# Patient Record
Sex: Female | Born: 1980 | Race: Black or African American | Hispanic: No | Marital: Single | Smoking: Never smoker
Health system: Southern US, Community
[De-identification: ages and names within clinical notes are randomized; demographics above are authoritative.]

## PROBLEM LIST (undated history)

## (undated) DIAGNOSIS — D219 Benign neoplasm of connective and other soft tissue, unspecified: Secondary | ICD-10-CM

## (undated) DIAGNOSIS — T7840XA Allergy, unspecified, initial encounter: Secondary | ICD-10-CM

## (undated) DIAGNOSIS — D649 Anemia, unspecified: Secondary | ICD-10-CM

## (undated) HISTORY — DX: Allergy, unspecified, initial encounter: T78.40XA

---

## 2001-02-06 ENCOUNTER — Emergency Department (HOSPITAL_COMMUNITY): Admission: EM | Admit: 2001-02-06 | Discharge: 2001-02-06 | Payer: Self-pay | Admitting: Emergency Medicine

## 2002-11-24 ENCOUNTER — Other Ambulatory Visit: Admission: RE | Admit: 2002-11-24 | Discharge: 2002-11-24 | Payer: Self-pay | Admitting: Obstetrics and Gynecology

## 2003-05-13 ENCOUNTER — Inpatient Hospital Stay (HOSPITAL_COMMUNITY): Admission: AD | Admit: 2003-05-13 | Discharge: 2003-05-16 | Payer: Self-pay | Admitting: Obstetrics & Gynecology

## 2003-07-02 ENCOUNTER — Other Ambulatory Visit: Admission: RE | Admit: 2003-07-02 | Discharge: 2003-07-02 | Payer: Self-pay | Admitting: *Deleted

## 2015-01-24 ENCOUNTER — Emergency Department (HOSPITAL_COMMUNITY): Payer: 59

## 2015-01-24 ENCOUNTER — Emergency Department (HOSPITAL_COMMUNITY)
Admission: EM | Admit: 2015-01-24 | Discharge: 2015-01-24 | Disposition: A | Discharge status: Home / Self Care | Payer: 59 | Attending: Emergency Medicine | Admitting: Emergency Medicine

## 2015-01-24 ENCOUNTER — Ambulatory Visit (INDEPENDENT_AMBULATORY_CARE_PROVIDER_SITE_OTHER): Payer: Self-pay | Admitting: Emergency Medicine

## 2015-01-24 ENCOUNTER — Encounter (HOSPITAL_COMMUNITY): Payer: Self-pay | Admitting: Nurse Practitioner

## 2015-01-24 VITALS — BP 120/82 | HR 95 | Temp 98.7°F | Resp 16 | Ht 64.0 in | Wt 132.8 lb

## 2015-01-24 DIAGNOSIS — R079 Chest pain, unspecified: Secondary | ICD-10-CM | POA: Insufficient documentation

## 2015-01-24 DIAGNOSIS — Z79899 Other long term (current) drug therapy: Secondary | ICD-10-CM | POA: Diagnosis not present

## 2015-01-24 DIAGNOSIS — M549 Dorsalgia, unspecified: Secondary | ICD-10-CM | POA: Insufficient documentation

## 2015-01-24 LAB — CBC
HCT: 37 % (ref 36.0–46.0)
Hemoglobin: 12.7 g/dL (ref 12.0–15.0)
MCH: 28.5 pg (ref 26.0–34.0)
MCHC: 34.3 g/dL (ref 30.0–36.0)
MCV: 83.1 fL (ref 78.0–100.0)
Platelets: 360 10*3/uL (ref 150–400)
RBC: 4.45 MIL/uL (ref 3.87–5.11)
RDW: 12.6 % (ref 11.5–15.5)
WBC: 6.3 10*3/uL (ref 4.0–10.5)

## 2015-01-24 LAB — BASIC METABOLIC PANEL
Anion gap: 11 (ref 5–15)
BUN: 9 mg/dL (ref 6–23)
CO2: 23 mmol/L (ref 19–32)
Calcium: 9.7 mg/dL (ref 8.4–10.5)
Chloride: 106 mmol/L (ref 96–112)
Creatinine, Ser: 0.82 mg/dL (ref 0.50–1.10)
GFR calc Af Amer: >90 mL/min (ref >90)
GFR calc non Af Amer: >90 mL/min (ref >90)
Glucose, Bld: 120 mg/dL — ABNORMAL HIGH (ref 70–99)
Potassium: 3.7 mmol/L (ref 3.5–5.1)
Sodium: 140 mmol/L (ref 135–145)

## 2015-01-24 LAB — I-STAT TROPONIN, ED
Troponin i, poc: 0 ng/mL (ref 0.00–0.08)
Troponin i, poc: 0 ng/mL (ref 0.00–0.08)

## 2015-01-24 MED ORDER — METHOCARBAMOL 500 MG PO TABS: 500.0000 mg | ORAL_TABLET | Freq: Two times a day (BID) | ORAL | Status: DC

## 2015-01-24 MED ORDER — MELOXICAM 7.5 MG PO TABS: 7.5000 mg | ORAL_TABLET | Freq: Every day | ORAL | Status: DC

## 2015-01-24 NOTE — ED Notes (Signed)
Pt reports chest pain since waking this am. Pain has been intermittent "heaviness" all day. Pain radiates into back and L shoulder. Nothing alleviates or aggravates the pain. She tried tums with no relief. She denis cardiac history. She is A&Ox4, breathing easy and unlabored

## 2015-01-24 NOTE — Progress Notes (Signed)
[facility] [address] [place] [id number] [phone]  Date:  01/24/2015   Name:  Erin Powell   DOB:  1981/01/10   MRN:  [id number]  PCP:  No primary care provider on file.    Chief Complaint: Chest Pain; Neck Pain; Shoulder Pain; and Fatigue   History of Present Illness:  Erin Powell is a 34 y.o. very pleasant female patient who presents with the following:  Patient has a recent history of chest pain Describes as though someone is sitting on her chest Radiates into the shoulders  Associated with sensation of lightheadedness but denies palpitations or tachyarrhythmia No nausea or vomiting No diaphoresis No GI symptoms No shortness of breath or wheezing. No cough Has tried tums with no improvement No fever or chills No history of premature CAD in family No history of HBP, HLD, DM.   Non smoker. No improvement with over the counter medications or other home remedies.  Denies other complaint or health concern today.   There are no active problems to display for this patient.   Past Medical History  Diagnosis Date  . Allergy     History reviewed. No pertinent past surgical history.  History  Substance Use Topics  . Smoking status: Never Smoker   . Smokeless tobacco: Not on file  . Alcohol Use: No    Family History  Problem Relation Age of Onset  . Diabetes Maternal Grandmother   . Diabetes Paternal Grandmother     No Known Allergies  Medication list has been reviewed and updated.  No current outpatient prescriptions on file prior to visit.   No current facility-administered medications on file prior to visit.    Review of Systems:  As per HPI, otherwise negative.    Physical Examination: Filed Vitals:   01/24/15 1435  BP: 120/82  Pulse: 95  Temp: 98.7 F (37.1 C)  Resp: 16   Filed Vitals:   01/24/15 1435  Height: 5\' 4"  (1.626 m)  Weight: 132 lb 12.8 oz (60.238 kg)   Body mass index is 22.78  kg/(m^2). Ideal Body Weight: Weight in (lb) to have BMI = 25: 145.3  GEN: WDWN, NAD, Non-toxic, A & O x 3 HEENT: Atraumatic, Normocephalic. Neck supple. No masses, No LAD. Ears and Nose: No external deformity. CV: RRR, No M/G/R. No JVD. No thrill. No extra heart sounds. PULM: CTA B, no wheezes, crackles, rhonchi. No retractions. No resp. distress. No accessory muscle use. ABD: S, NT, ND, +BS. No rebound. No HSM. EXTR: No c/c/e NEURO Normal gait.  PSYCH: Normally interactive. Conversant. Not depressed or anxious appearing.  Calm demeanor.    Assessment and Plan: Chest pain  Atypical presentation Normal EKG To ER via EMS Refused EMS will drive self.  Advised not best choice  Signed,  Ellison Carwin, MD

## 2015-01-24 NOTE — Patient Instructions (Signed)

## 2015-01-24 NOTE — Discharge Instructions (Signed)
Back Pain, Adult °Low back pain is very common. About 1 in 5 people have back pain. The cause of low back pain is rarely dangerous. The pain often gets better over time. About half of people with a sudden onset of back pain feel better in just 2 weeks. About 8 in 10 people feel better by 6 weeks.  °CAUSES °Some common causes of back pain include: °· Strain of the muscles or ligaments supporting the spine. °· Wear and tear (degeneration) of the spinal discs. °· Arthritis. °· Direct injury to the back. °DIAGNOSIS °Most of the time, the direct cause of low back pain is not known. However, back pain can be treated effectively even when the exact cause of the pain is unknown. Answering your caregiver's questions about your overall health and symptoms is one of the most accurate ways to make sure the cause of your pain is not dangerous. If your caregiver needs more information, he or she may order lab work or imaging tests (X-rays or MRIs). However, even if imaging tests show changes in your back, this usually does not require surgery. °HOME CARE INSTRUCTIONS °For many people, back pain returns. Since low back pain is rarely dangerous, it is often a condition that people can learn to manage on their own.  °· Remain active. It is stressful on the back to sit or stand in one place. Do not sit, drive, or stand in one place for more than 30 minutes at a time. Take short walks on level surfaces as soon as pain allows. Try to increase the length of time you walk each day. °· Do not stay in bed. Resting more than 1 or 2 days can delay your recovery. °· Do not avoid exercise or work. Your body is made to move. It is not dangerous to be active, even though your back may hurt. Your back will likely heal faster if you return to being active before your pain is gone. °· Pay attention to your body when you  bend and lift. Many people have less discomfort when lifting if they bend their knees, keep the load close to their bodies, and  avoid twisting. Often, the most comfortable positions are those that put less stress on your recovering back. °· Find a comfortable position to sleep. Use a firm mattress and lie on your side with your knees slightly bent. If you lie on your back, put a pillow under your knees. °· Only take over-the-counter or prescription medicines as directed by your caregiver. Over-the-counter medicines to reduce pain and inflammation are often the most helpful. Your caregiver may prescribe muscle relaxant drugs. These medicines help dull your pain so you can more quickly return to your normal activities and healthy exercise. °· Put ice on the injured area. °¨ Put ice in a plastic bag. °¨ Place a towel between your skin and the bag. °¨ Leave the ice on for 15-20 minutes, 03-04 times a day for the first 2 to 3 days. After that, ice and heat may be alternated to reduce pain and spasms. °· Ask your caregiver about trying back exercises and gentle massage. This may be of some benefit. °· Avoid feeling anxious or stressed. Stress increases muscle tension and can worsen back pain. It is important to recognize when you are anxious or stressed and learn ways to manage it. Exercise is a great option. °SEEK MEDICAL CARE IF: °· You have pain that is not relieved with rest or medicine. °· You have pain that does not improve in 1 week. °· You have new symptoms. °· You are generally not feeling well. °SEEK   IMMEDIATE MEDICAL CARE IF:  °· You have pain that radiates from your back into your legs. °· You develop new bowel or bladder control problems. °· You have unusual weakness or numbness in your arms or legs. °· You develop nausea or vomiting. °· You develop abdominal pain. °· You feel faint. °Document Released: 09/24/2005 Document Revised: 03/25/2012 Document Reviewed: 01/26/2014 °ExitCare® Patient Information ©2015 [employer]. This information is not intended to replace advice given to you by your health care provider. Make sure you  discuss any questions you have with your health care provider. °Chest Pain (Nonspecific) °It is often hard to give a specific diagnosis for the cause of chest pain. There is always a chance that your pain could be related to something serious, such as a heart attack or a blood clot in the lungs. You need to follow up with your health care provider for further evaluation. °CAUSES  °· Heartburn. °· Pneumonia or bronchitis. °· Anxiety or stress. °· Inflammation around your heart (pericarditis) or lung (pleuritis or pleurisy). °· A blood clot in the lung. °· A collapsed lung (pneumothorax). It can develop suddenly on its own (spontaneous pneumothorax) or from trauma to the chest. °· Shingles infection (herpes zoster virus). °The chest wall is composed of bones, muscles, and cartilage. Any of these can be the source of the pain. °· The bones can be bruised by injury. °· The muscles or cartilage can be strained by coughing or overwork. °· The cartilage can be affected by inflammation and become sore (costochondritis). °DIAGNOSIS  °Lab tests or other studies may be needed to find the cause of your pain. Your health care provider may have you take a test called an ambulatory electrocardiogram (ECG). An ECG records your heartbeat patterns over a 24-hour period. You may also have other tests, such as: °· Transthoracic echocardiogram (TTE). During echocardiography, sound waves are used to evaluate how blood flows through your heart. °· Transesophageal echocardiogram (TEE). °· Cardiac monitoring. This allows your health care provider to monitor your heart rate and rhythm in real time. °· Holter monitor. This is a portable device that records your heartbeat and can help diagnose heart arrhythmias. It allows your health care provider to track your heart activity for several days, if needed. °· Stress tests by exercise or by giving medicine that makes the heart beat faster. °TREATMENT  °· Treatment depends on what may be causing  your chest pain. Treatment may include: °· Acid blockers for heartburn. °· Anti-inflammatory medicine. °· Pain medicine for inflammatory conditions. °· Antibiotics if an infection is present. °· You may be advised to change lifestyle habits. This includes stopping smoking and avoiding alcohol, caffeine, and chocolate. °· You may be advised to keep your head raised (elevated) when sleeping. This reduces the chance of acid going backward from your stomach into your esophagus. °Most of the time, nonspecific chest pain will improve within 2-3 days with rest and mild pain medicine.  °HOME CARE INSTRUCTIONS  °· If antibiotics were prescribed, take them as directed. Finish them even if you start to feel better. °· For the next few days, avoid physical activities that bring on chest pain. Continue physical activities as directed. °· Do not use any tobacco products, including cigarettes, chewing tobacco, or electronic cigarettes. °· Avoid drinking alcohol. °· Only take medicine as directed by your health care provider. °· Follow your health care provider's suggestions for further testing if your chest pain does not go away. °· Keep any follow-up appointments you   made. If you do not go to an appointment, you could develop lasting (chronic) problems with pain. If there is any problem keeping an appointment, call to reschedule. °SEEK MEDICAL CARE IF:  °· Your chest pain does not go away, even after treatment. °· You have a rash with blisters on your chest. °· You have a fever. °SEEK IMMEDIATE MEDICAL CARE IF:  °· You have increased chest pain or pain that spreads to your arm, neck, jaw, back, or abdomen. °· You have shortness of breath. °· You have an increasing cough, or you cough up blood. °· You have severe back or abdominal pain. °· You feel nauseous or vomit. °· You have severe weakness. °· You faint. °· You have chills. °This is an emergency. Do not wait to see if the pain will go away. Get medical help at once. Call your  local emergency services (911 in [place] Do not drive yourself to the hospital. °MAKE SURE YOU:  °· Understand these instructions. °· Will watch your condition. °· Will get help right away if you are not doing well or get worse. °Document Released: 07/04/2005 Document Revised: 09/29/2013 Document Reviewed: 04/29/2008 °ExitCare® Patient Information ©2015 [employer]. This information is not intended to replace advice given to you by your health care provider. Make sure you discuss any questions you have with your health care provider. ° °

## 2015-01-24 NOTE — ED Provider Notes (Signed)
CSN: [id number]     Arrival date & time 01/24/15  1608 History   First MD Initiated Contact with Patient 01/24/15 1927     Chief Complaint  Patient presents with  . Chest Pain     (Consider location/radiation/quality/duration/timing/severity/associated sxs/prior Treatment) Patient is a 34 y.o. female presenting with chest pain. The history is provided by the patient. No language interpreter was used.  Chest Pain Pain location:  L chest Pain quality: aching   Pain radiates to:  Upper back, neck, L shoulder and R shoulder Pain radiates to the back: yes   Pain severity:  Moderate Onset quality:  Gradual Duration:  12 hours Timing:  Constant Progression:  Worsening Chronicity:  New Context: stress   Context: not breathing   Relieved by:  Nothing Worsened by:  Nothing tried Ineffective treatments:  None tried Associated symptoms: back pain   Associated symptoms: no abdominal pain, no cough, no lower extremity edema, no nausea, no shortness of breath and not vomiting   Risk factors: no birth control, no coronary artery disease, no diabetes mellitus, no high cholesterol and no hypertension   Pt seen at Urgent care. Pt sent here for cardiac evaluation.  Pt reports she has had soreness in neck and upper back for the past week.  Pt reports work stress. No cardiac risk.   Past Medical History  Diagnosis Date  . Allergy    History reviewed. No pertinent past surgical history. Family History  Problem Relation Age of Onset  . Diabetes Maternal Grandmother   . Diabetes Paternal Grandmother    History  Substance Use Topics  . Smoking status: Never Smoker   . Smokeless tobacco: Not on file  . Alcohol Use: No   OB History    No data available     Review of Systems  Respiratory: Negative for cough and shortness of breath.   Cardiovascular: Positive for chest pain.  Gastrointestinal: Negative for nausea, vomiting and abdominal pain.  Musculoskeletal: Positive for back pain.  All  other systems reviewed and are negative.     Allergies  Review of patient's allergies indicates no known allergies.  Home Medications   Prior to Admission medications   Medication Sig Start Date End Date Taking? Authorizing Provider  diphenhydrAMINE (BENADRYL) 12.5 MG/5ML elixir Take 12.5 mg by mouth daily as needed for allergies.   Yes Historical Provider, MD  ibuprofen (ADVIL,MOTRIN) 200 MG tablet Take 400 mg by mouth every 6 (six) hours as needed.   Yes Historical Provider, MD  Multiple Vitamin (MULTI-VITAMIN PO) Take 1 tablet by mouth daily.   Yes Historical Provider, MD   BP 122/88 mmHg  Pulse 76  Temp(Src) 98.3 F (36.8 C) (Oral)  Resp 13  Ht 5\' 4"  (1.626 m)  Wt 133 lb 3.2 oz (60.419 kg)  BMI 22.85 kg/m2  SpO2 100%  LMP 01/18/2015 Physical Exam  Constitutional: She appears well-developed and well-nourished.  HENT:  Head: Normocephalic.  Right Ear: External ear normal.  Left Ear: External ear normal.  Nose: Nose normal.  Mouth/Throat: Oropharynx is clear and moist.  Eyes: Conjunctivae and EOM are normal. Pupils are equal, round, and reactive to light.  Neck: Normal range of motion. Neck supple.  Cardiovascular: Normal rate and normal heart sounds.   Pulmonary/Chest: Effort normal and breath sounds normal.  Abdominal: Soft.  Musculoskeletal: She exhibits edema.  Neurological: She is alert.  Skin: Skin is warm.  Nursing note and vitals reviewed.   ED Course  Procedures (including critical care  time) Labs Review Labs Reviewed  BASIC METABOLIC PANEL - Abnormal; Notable for the following:    Glucose, Bld 120 (*)    All other components within normal limits  CBC  I-STAT TROPOININ, ED    Imaging Review Dg Chest 2 View  01/24/2015   CLINICAL DATA:  Chest pain  EXAM: CHEST  2 VIEW  COMPARISON:  None.  FINDINGS: The heart size and mediastinal contours are within normal limits. Both lungs are clear. The visualized skeletal structures are unremarkable.  IMPRESSION:  No active cardiopulmonary disease.   Electronically Signed   By: Marybelle Killings M.D.   On: 01/24/2015 17:44     EKG Interpretation None      MDM troponin negative x 2.   Pain sound radicular,   Pain in both shoulder, upper back and neck.   I will try pt on meloxicam and robaxin.   Pt advised to see her primary MD for recheck.    Final diagnoses:  Chest pain, unspecified chest pain type  Bilateral back pain, unspecified location   Out of work x 3 days Robaxin meloxicam      Fransico Meadow, PA-C 01/24/15 2320  Noemi Chapel, MD 01/25/15 0025

## 2018-05-09 ENCOUNTER — Other Ambulatory Visit: Payer: Self-pay | Admitting: Family Medicine

## 2018-05-09 ENCOUNTER — Other Ambulatory Visit (HOSPITAL_COMMUNITY)
Admission: RE | Admit: 2018-05-09 | Discharge: 2018-05-09 | Disposition: A | Discharge status: Home / Self Care | Payer: 59 | Source: Ambulatory Visit | Attending: Family Medicine | Admitting: Family Medicine

## 2018-05-09 DIAGNOSIS — Z124 Encounter for screening for malignant neoplasm of cervix: Secondary | ICD-10-CM | POA: Diagnosis present

## 2018-05-14 LAB — CYTOLOGY - PAP: Diagnosis: NEGATIVE

## 2019-02-25 ENCOUNTER — Other Ambulatory Visit: Payer: Self-pay | Admitting: Obstetrics and Gynecology

## 2019-07-01 ENCOUNTER — Telehealth: Payer: Self-pay | Admitting: Hematology and Oncology

## 2019-07-01 NOTE — Telephone Encounter (Signed)
Received a new patient referral from Fawn Kirk, [place] for anemia. Erin Powell has been cld and scheduled to see Dr. Lindi Adie on 10/13 at 2pm. Pt states that she doesn't have insurance. I provided the number [phone] to be fwd to the financial advocates. She's also been made aware to arrive 15 minutes early

## 2019-07-20 NOTE — Progress Notes (Signed)
[name], [name], [name] She is referred by Erin Kirk, PA. She has a history of easy bruising and heavy menstrual periods. She was seen in gynecology in 02/2019 and advised to start oral iron. Labs on 02/25/19 showed Hg 10.8, MCV 77.8, HCT 33.0, platelets 590. Labs on 05/01/19 showed Hg 9.8, MCV 76.5, HCT 29.3, platelets 494, ferritin 5.3. She presents to the clinic today for initial evaluation and discussion of treatment options.   I reviewed her records extensively and collaborated the history with the patient.  MEDICAL HISTORY:  Past Medical History:  Diagnosis Date  . Allergy     SURGICAL HISTORY: No past surgical history on file.  SOCIAL HISTORY: Social History   Socioeconomic History  . Marital status: Single    Spouse name: Not on file  . Number of children: Not on file  . Years of education: Not on file  . Highest education level: Not on file  Occupational History  . Not on file  Social Needs  . Financial resource strain: Not on file  . Food insecurity    Worry: Not on file    Inability: Not on file  . Transportation needs    Medical: Not on file    Non-medical: Not on file  Tobacco Use  . Smoking status: Never Smoker  Substance and Sexual Activity  . Alcohol use: No    Alcohol/week: 0.0 standard drinks  . Drug use: No  . Sexual activity: Not on file  Lifestyle  . Physical activity    Days per week: Not on file    Minutes per session: Not on file  . Stress: Not on file  Relationships  . Social Herbalist on phone: Not on file    Gets together: Not on file    Attends religious service: Not on file    Active member of club or organization:  Not on file    Attends meetings of clubs or organizations: Not on file    Relationship status: Not on file  . Intimate partner violence    Fear of current or ex partner: Not on file    Emotionally abused: Not on file    Physically abused: Not on file    Forced sexual activity: Not on file  Other Topics Concern  . Not on file  Social History Narrative  . Not on file    FAMILY HISTORY: Family History  Problem Relation Age of Onset  . Diabetes Maternal Grandmother   . Diabetes Paternal Grandmother     ALLERGIES:  has No Known Allergies.  MEDICATIONS:  Current Outpatient Medications  Medication Sig Dispense Refill  . diphenhydrAMINE (BENADRYL) 12.5 MG/5ML elixir Take 12.5 mg by mouth daily as needed for allergies.    Marland Kitchen ibuprofen (ADVIL,MOTRIN) 200 MG tablet Take 400 mg by mouth every 6 (six) hours as needed.    . meloxicam (MOBIC) 7.5 MG tablet Take 1 tablet (7.5 mg total) by mouth daily. 20 tablet 0  . methocarbamol (ROBAXIN) 500 MG tablet Take 1 tablet (500 mg total) by mouth 2 (two) times daily. 20 tablet 0  . Multiple Vitamin (MULTI-VITAMIN PO) Take 1 tablet by mouth daily.     No current facility-administered medications  for this visit.     REVIEW OF SYSTEMS:   Constitutional: Denies fevers, chills or abnormal night sweats Eyes: Denies blurriness of vision, double vision or watery eyes Ears, nose, mouth, throat, and face: Denies mucositis or sore throat Respiratory: Denies cough, dyspnea or wheezes Cardiovascular: Denies palpitation, chest discomfort or lower extremity swelling Gastrointestinal:  Denies nausea, heartburn or change in bowel habits Skin: Denies abnormal skin rashes Lymphatics: Denies new lymphadenopathy or easy bruising Neurological:Denies numbness, tingling or new weaknesses Behavioral/Psych: Mood is stable, no new changes  Breast: Denies any palpable lumps or discharge All other systems were reviewed with the patient and are negative.  PHYSICAL  EXAMINATION: ECOG PERFORMANCE STATUS: 1 - Symptomatic but completely ambulatory  Vitals:   07/21/19 1430  BP: (!) 156/92  Pulse: (!) 115  Resp: 18  Temp: 97.8 F (36.6 C)  SpO2: 100%   There were no vitals filed for this visit.  GENERAL:alert, no distress and comfortable SKIN: skin color, texture, turgor are normal, no rashes or significant lesions EYES: normal, conjunctiva are pink and non-injected, sclera clear OROPHARYNX:no exudate, no erythema and lips, buccal mucosa, and tongue normal  NECK: supple, thyroid normal size, non-tender, without nodularity LYMPH:  no palpable lymphadenopathy in the cervical, axillary or inguinal LUNGS: clear to auscultation and percussion with normal breathing effort HEART: regular rate & rhythm and no murmurs and no lower extremity edema ABDOMEN:abdomen soft, non-tender and normal bowel sounds Musculoskeletal:no cyanosis of digits and no clubbing  PSYCH: alert & oriented x 3 with fluent speech NEURO: no focal motor/sensory deficits  LABORATORY DATA:  I have reviewed the data as listed Lab Results  Component Value Date   WBC 7.5 07/21/2019   HGB 9.9 (L) 07/21/2019   HCT 30.3 (L) 07/21/2019   MCV 76.3 (L) 07/21/2019   PLT 526 (H) 07/21/2019   Lab Results  Component Value Date   NA 140 01/24/2015   K 3.7 01/24/2015   CL 106 01/24/2015   CO2 23 01/24/2015    RADIOGRAPHIC STUDIES: I have personally reviewed the radiological reports and agreed with the findings in the report.  ASSESSMENT AND PLAN:  Iron deficiency anemia due to chronic blood loss 05/09/2018: Hemoglobin 11.2 02/26/2019: Hemoglobin 10.8, MCV 77.8 05/01/2019: Hemoglobin 9.8, MCV 76.5, platelets 590 07/21/2019: Hemoglobin 9.9, MCV 76.3, platelets 526  Microcytic anemia: Most likely diagnosis is iron deficiency anemia due to chronic heavy menstrual bleeding due to uterine fibroids. Treatment plan: IV iron therapy if iron levels are low Patient has classic symptoms of iron  deficiency  Thrombocytosis is reactive secondary to iron deficiency.  Patient to discuss with her gynecologist about options to stop the bleeding. She wants more kids and doesn't want to consider hysterectomy  Will call her tomorrow with results of lab work  All questions were answered. The patient knows to call the clinic with any problems, questions or concerns.   Rulon Eisenmenger, MD, MPH 07/21/2019    I, Molly Dorshimer, am acting as scribe for Nicholas Lose, MD.  I have reviewed the above documentation for accuracy and completeness, and I agree with the above.

## 2019-07-21 ENCOUNTER — Inpatient Hospital Stay: Payer: Self-pay | Attending: Hematology and Oncology | Admitting: Hematology and Oncology

## 2019-07-21 ENCOUNTER — Other Ambulatory Visit: Payer: Self-pay

## 2019-07-21 ENCOUNTER — Inpatient Hospital Stay: Payer: Self-pay

## 2019-07-21 DIAGNOSIS — N920 Excessive and frequent menstruation with regular cycle: Secondary | ICD-10-CM | POA: Insufficient documentation

## 2019-07-21 DIAGNOSIS — D500 Iron deficiency anemia secondary to blood loss (chronic): Secondary | ICD-10-CM

## 2019-07-21 DIAGNOSIS — D259 Leiomyoma of uterus, unspecified: Secondary | ICD-10-CM | POA: Insufficient documentation

## 2019-07-21 DIAGNOSIS — Z23 Encounter for immunization: Secondary | ICD-10-CM | POA: Insufficient documentation

## 2019-07-21 LAB — CBC WITH DIFFERENTIAL (CANCER CENTER ONLY)
Abs Immature Granulocytes: 0.02 10*3/uL (ref 0.00–0.07)
Basophils Absolute: 0 10*3/uL (ref 0.0–0.1)
Basophils Relative: 1 %
Eosinophils Absolute: 0 10*3/uL (ref 0.0–0.5)
Eosinophils Relative: 1 %
HCT: 30.3 % — ABNORMAL LOW (ref 36.0–46.0)
Hemoglobin: 9.9 g/dL — ABNORMAL LOW (ref 12.0–15.0)
Immature Granulocytes: 0 %
Lymphocytes Relative: 19 %
Lymphs Abs: 1.4 10*3/uL (ref 0.7–4.0)
MCH: 24.9 pg — ABNORMAL LOW (ref 26.0–34.0)
MCHC: 32.7 g/dL (ref 30.0–36.0)
MCV: 76.3 fL — ABNORMAL LOW (ref 80.0–100.0)
Monocytes Absolute: 0.4 10*3/uL (ref 0.1–1.0)
Monocytes Relative: 5 %
Neutro Abs: 5.6 10*3/uL (ref 1.7–7.7)
Neutrophils Relative %: 74 %
Platelet Count: 526 10*3/uL — ABNORMAL HIGH (ref 150–400)
RBC: 3.97 MIL/uL (ref 3.87–5.11)
RDW: 14.7 % (ref 11.5–15.5)
WBC Count: 7.5 10*3/uL (ref 4.0–10.5)
nRBC: 0 % (ref 0.0–0.2)

## 2019-07-21 LAB — RETICULOCYTES
Immature Retic Fract: 26.2 % — ABNORMAL HIGH (ref 2.3–15.9)
RBC.: 3.98 MIL/uL (ref 3.87–5.11)
Retic Count, Absolute: 72 10*3/uL (ref 19.0–186.0)
Retic Ct Pct: 1.8 % (ref 0.4–3.1)

## 2019-07-21 NOTE — Assessment & Plan Note (Signed)
05/09/2018: Hemoglobin 11.2 02/26/2019: Hemoglobin 10.8, MCV 77.8 05/01/2019: Hemoglobin 9.8, MCV 76.5, platelets 590 Microcytic anemia: Most likely diagnosis is chronic heavy menstrual bleeding due to uterine fibroids. Treatment plan: IV iron therapy Patient to discuss with her gynecologist about options to stop the bleeding.  Return to clinic in 2 months to assess response to IV iron therapy.

## 2019-07-22 ENCOUNTER — Other Ambulatory Visit: Payer: Self-pay | Admitting: Hematology and Oncology

## 2019-07-22 ENCOUNTER — Telehealth: Payer: Self-pay | Admitting: Hematology and Oncology

## 2019-07-22 LAB — FERRITIN: Ferritin: <4 ng/mL — ABNORMAL LOW (ref 11–307)

## 2019-07-22 LAB — IRON AND TIBC
Iron: 25 ug/dL — ABNORMAL LOW (ref 41–142)
Saturation Ratios: 4 % — ABNORMAL LOW (ref 21–57)
TIBC: 614 ug/dL — ABNORMAL HIGH (ref 236–444)
UIBC: 589 ug/dL — ABNORMAL HIGH (ref 120–384)

## 2019-07-22 NOTE — Telephone Encounter (Signed)
Scheduled appt per 10/14 sch message - pt aware of appt date and times

## 2019-07-22 NOTE — Progress Notes (Signed)
I informed the patient that the iron results came back extremely low. Ferritin is less than 4 TIBC 614 Patient will need IV iron therapy. I sent a message to scheduling to get her 4 doses of IV iron 1 week apart with Venofer.

## 2019-07-24 ENCOUNTER — Other Ambulatory Visit: Payer: Self-pay

## 2019-07-24 ENCOUNTER — Inpatient Hospital Stay: Payer: Self-pay

## 2019-07-24 VITALS — BP 144/94 | HR 102 | Temp 98.2°F | Resp 16

## 2019-07-24 DIAGNOSIS — D500 Iron deficiency anemia secondary to blood loss (chronic): Secondary | ICD-10-CM

## 2019-07-24 DIAGNOSIS — Z23 Encounter for immunization: Secondary | ICD-10-CM

## 2019-07-24 MED ORDER — ACETAMINOPHEN 325 MG PO TABS
ORAL_TABLET | ORAL | Status: AC
  Filled 2019-07-24: qty 2

## 2019-07-24 MED ORDER — INFLUENZA VAC SPLIT QUAD 0.5 ML IM SUSY
PREFILLED_SYRINGE | INTRAMUSCULAR | Status: AC
  Filled 2019-07-24: qty 0.5

## 2019-07-24 MED ORDER — INFLUENZA VAC SPLIT QUAD 0.5 ML IM SUSY
0.5000 mL | PREFILLED_SYRINGE | Freq: Once | INTRAMUSCULAR | Status: AC
  Administered 2019-07-24: 0.5 mL via INTRAMUSCULAR

## 2019-07-24 MED ORDER — ACETAMINOPHEN 325 MG PO TABS
650.0000 mg | ORAL_TABLET | Freq: Once | ORAL | Status: AC
  Administered 2019-07-24: 09:00:00 650 mg via ORAL

## 2019-07-24 MED ORDER — SODIUM CHLORIDE 0.9 % IV SOLN
Freq: Once | INTRAVENOUS | Status: AC
  Administered 2019-07-24: 08:00:00 via INTRAVENOUS
  Filled 2019-07-24: qty 250

## 2019-07-24 MED ORDER — SODIUM CHLORIDE 0.9 % IV SOLN
200.0000 mg | Freq: Once | INTRAVENOUS | Status: AC
  Administered 2019-07-24: 200 mg via INTRAVENOUS
  Filled 2019-07-24: qty 10

## 2019-07-24 NOTE — Patient Instructions (Signed)

## 2019-07-31 ENCOUNTER — Other Ambulatory Visit: Payer: Self-pay

## 2019-07-31 ENCOUNTER — Inpatient Hospital Stay: Payer: Self-pay

## 2019-07-31 VITALS — BP 127/87 | HR 99 | Temp 98.3°F | Resp 16

## 2019-07-31 DIAGNOSIS — D500 Iron deficiency anemia secondary to blood loss (chronic): Secondary | ICD-10-CM

## 2019-07-31 MED ORDER — ACETAMINOPHEN 325 MG PO TABS
ORAL_TABLET | ORAL | Status: AC
  Filled 2019-07-31: qty 2

## 2019-07-31 MED ORDER — ACETAMINOPHEN 325 MG PO TABS
650.0000 mg | ORAL_TABLET | Freq: Once | ORAL | Status: AC
  Administered 2019-07-31: 650 mg via ORAL

## 2019-07-31 MED ORDER — SODIUM CHLORIDE 0.9 % IV SOLN
Freq: Once | INTRAVENOUS | Status: AC
  Administered 2019-07-31: 09:00:00 via INTRAVENOUS
  Filled 2019-07-31: qty 250

## 2019-07-31 MED ORDER — SODIUM CHLORIDE 0.9 % IV SOLN
200.0000 mg | Freq: Once | INTRAVENOUS | Status: AC
  Administered 2019-07-31: 200 mg via INTRAVENOUS
  Filled 2019-07-31: qty 10

## 2019-07-31 NOTE — Patient Instructions (Signed)

## 2019-08-07 ENCOUNTER — Other Ambulatory Visit: Payer: Self-pay

## 2019-08-07 ENCOUNTER — Inpatient Hospital Stay: Payer: Self-pay

## 2019-08-07 VITALS — BP 120/87 | HR 96 | Temp 98.2°F | Resp 18

## 2019-08-07 DIAGNOSIS — D500 Iron deficiency anemia secondary to blood loss (chronic): Secondary | ICD-10-CM

## 2019-08-07 MED ORDER — ACETAMINOPHEN 325 MG PO TABS
ORAL_TABLET | ORAL | Status: AC
  Filled 2019-08-07: qty 2

## 2019-08-07 MED ORDER — ACETAMINOPHEN 325 MG PO TABS
650.0000 mg | ORAL_TABLET | Freq: Once | ORAL | Status: AC
  Administered 2019-08-07: 650 mg via ORAL

## 2019-08-07 MED ORDER — SODIUM CHLORIDE 0.9 % IV SOLN
200.0000 mg | Freq: Once | INTRAVENOUS | Status: AC
  Administered 2019-08-07: 200 mg via INTRAVENOUS
  Filled 2019-08-07: qty 10

## 2019-08-07 MED ORDER — SODIUM CHLORIDE 0.9 % IV SOLN
Freq: Once | INTRAVENOUS | Status: AC
  Administered 2019-08-07: 16:00:00 via INTRAVENOUS
  Filled 2019-08-07: qty 250

## 2019-08-07 NOTE — Patient Instructions (Signed)

## 2019-08-14 ENCOUNTER — Inpatient Hospital Stay: Payer: Self-pay | Attending: Hematology and Oncology

## 2019-08-14 ENCOUNTER — Other Ambulatory Visit: Payer: Self-pay

## 2019-08-14 VITALS — BP 105/68 | HR 87 | Temp 99.6°F | Resp 18

## 2019-08-14 DIAGNOSIS — N920 Excessive and frequent menstruation with regular cycle: Secondary | ICD-10-CM | POA: Insufficient documentation

## 2019-08-14 DIAGNOSIS — D500 Iron deficiency anemia secondary to blood loss (chronic): Secondary | ICD-10-CM | POA: Insufficient documentation

## 2019-08-14 DIAGNOSIS — Z79899 Other long term (current) drug therapy: Secondary | ICD-10-CM | POA: Insufficient documentation

## 2019-08-14 MED ORDER — SODIUM CHLORIDE 0.9 % IV SOLN
200.0000 mg | Freq: Once | INTRAVENOUS | Status: AC
  Administered 2019-08-14: 200 mg via INTRAVENOUS
  Filled 2019-08-14: qty 10

## 2019-08-14 MED ORDER — ACETAMINOPHEN 325 MG PO TABS
ORAL_TABLET | ORAL | Status: AC
  Filled 2019-08-14: qty 2

## 2019-08-14 MED ORDER — SODIUM CHLORIDE 0.9 % IV SOLN
Freq: Once | INTRAVENOUS | Status: AC
  Administered 2019-08-14: 15:00:00 via INTRAVENOUS
  Filled 2019-08-14: qty 250

## 2019-08-14 MED ORDER — ACETAMINOPHEN 325 MG PO TABS
650.0000 mg | ORAL_TABLET | Freq: Once | ORAL | Status: AC
  Administered 2019-08-14: 650 mg via ORAL

## 2019-08-14 NOTE — Progress Notes (Signed)
Pt declined to stay for 30 minute observation post Venofer. Vital signs stable. Discharged in no acute distress.

## 2019-08-14 NOTE — Patient Instructions (Signed)

## 2019-08-19 ENCOUNTER — Encounter: Payer: Self-pay | Admitting: Pharmacy Technician

## 2019-09-22 ENCOUNTER — Telehealth: Payer: Self-pay | Admitting: Hematology and Oncology

## 2019-09-22 NOTE — Telephone Encounter (Signed)
VG Call Day 1/14 moved f/u to 1/18. Left message. Schedule mailed.

## 2019-10-19 ENCOUNTER — Other Ambulatory Visit: Payer: Self-pay | Admitting: *Deleted

## 2019-10-19 DIAGNOSIS — D500 Iron deficiency anemia secondary to blood loss (chronic): Secondary | ICD-10-CM

## 2019-10-20 ENCOUNTER — Other Ambulatory Visit: Payer: Self-pay

## 2019-10-20 ENCOUNTER — Inpatient Hospital Stay: Payer: Self-pay | Attending: Hematology and Oncology

## 2019-10-20 DIAGNOSIS — N920 Excessive and frequent menstruation with regular cycle: Secondary | ICD-10-CM | POA: Insufficient documentation

## 2019-10-20 DIAGNOSIS — D500 Iron deficiency anemia secondary to blood loss (chronic): Secondary | ICD-10-CM | POA: Insufficient documentation

## 2019-10-20 DIAGNOSIS — Z79899 Other long term (current) drug therapy: Secondary | ICD-10-CM | POA: Insufficient documentation

## 2019-10-20 LAB — CBC WITH DIFFERENTIAL (CANCER CENTER ONLY)
Abs Immature Granulocytes: 0.02 10*3/uL (ref 0.00–0.07)
Basophils Absolute: 0 10*3/uL (ref 0.0–0.1)
Basophils Relative: 1 %
Eosinophils Absolute: 0.1 10*3/uL (ref 0.0–0.5)
Eosinophils Relative: 1 %
HCT: 33.1 % — ABNORMAL LOW (ref 36.0–46.0)
Hemoglobin: 11.2 g/dL — ABNORMAL LOW (ref 12.0–15.0)
Immature Granulocytes: 0 %
Lymphocytes Relative: 35 %
Lymphs Abs: 1.9 10*3/uL (ref 0.7–4.0)
MCH: 27.6 pg (ref 26.0–34.0)
MCHC: 33.8 g/dL (ref 30.0–36.0)
MCV: 81.5 fL (ref 80.0–100.0)
Monocytes Absolute: 0.3 10*3/uL (ref 0.1–1.0)
Monocytes Relative: 6 %
Neutro Abs: 3.1 10*3/uL (ref 1.7–7.7)
Neutrophils Relative %: 57 %
Platelet Count: 527 10*3/uL — ABNORMAL HIGH (ref 150–400)
RBC: 4.06 MIL/uL (ref 3.87–5.11)
RDW: 13.9 % (ref 11.5–15.5)
WBC Count: 5.4 10*3/uL (ref 4.0–10.5)
nRBC: 0 % (ref 0.0–0.2)

## 2019-10-21 LAB — IRON AND TIBC
Iron: 49 ug/dL (ref 41–142)
Saturation Ratios: 11 % — ABNORMAL LOW (ref 21–57)
TIBC: 441 ug/dL (ref 236–444)
UIBC: 392 ug/dL — ABNORMAL HIGH (ref 120–384)

## 2019-10-21 LAB — FERRITIN: Ferritin: 6 ng/mL — ABNORMAL LOW (ref 11–307)

## 2019-10-22 ENCOUNTER — Ambulatory Visit: Payer: Self-pay | Admitting: Hematology and Oncology

## 2019-10-25 NOTE — Progress Notes (Signed)
Patient Care Team: Cleda Mccreedy as PCP - General (Nurse Practitioner)  DIAGNOSIS:    ICD-10-CM   1. Iron deficiency anemia due to chronic blood loss  D50.0     CHIEF COMPLIANT: Follow-up of anemia and thrombocytosis  INTERVAL HISTORY: Erin Powell is a 39 y.o. with above-mentioned history of thrombocytosis and iron deficiency anemia due to chronic heavy menstrual bleeding treated with IV iron. Labs on 10/20/19 showed Hg 11.2, HCT 33.1, platelets 527, iron saturation 11%, UIBC 392, ferritin 6. She presents to the clinic today to review her labs.  She continues to have heavy cycles and whenever she bleeds so much she feels extremely fatigued.  She has not yet seen her gynecologist to talk about different options.  She tells me that 2 once she gets her insurance in February she would like to get the ball rolling on that.  She does feel moderately better since iron infusion was given but not fully there yet.  She had blood work done couple of days ago.  ALLERGIES:  has No Known Allergies.  MEDICATIONS:  Current Outpatient Medications  Medication Sig Dispense Refill  . diphenhydrAMINE (BENADRYL) 12.5 MG/5ML elixir Take 12.5 mg by mouth daily as needed for allergies.    Marland Kitchen ibuprofen (ADVIL,MOTRIN) 200 MG tablet Take 400 mg by mouth every 6 (six) hours as needed.    . meloxicam (MOBIC) 7.5 MG tablet Take 1 tablet (7.5 mg total) by mouth daily. 20 tablet 0  . methocarbamol (ROBAXIN) 500 MG tablet Take 1 tablet (500 mg total) by mouth 2 (two) times daily. 20 tablet 0  . Multiple Vitamin (MULTI-VITAMIN PO) Take 1 tablet by mouth daily.     No current facility-administered medications for this visit.    PHYSICAL EXAMINATION: ECOG PERFORMANCE STATUS: 1 - Symptomatic but completely ambulatory  Vitals:   10/26/19 1517  BP: 129/85  Pulse: 100  Resp: 20  Temp: 98.9 F (37.2 C)  SpO2: 100%   Filed Weights   10/26/19 1517  Weight: 146 lb 4.8 oz (66.4 kg)    LABORATORY DATA:  I  have reviewed the data as listed CMP Latest Ref Rng & Units 01/24/2015  Glucose 70 - 99 mg/dL 120(H)  BUN 6 - 23 mg/dL 9  Creatinine 0.50 - 1.10 mg/dL 0.82  Sodium 135 - 145 mmol/L 140  Potassium 3.5 - 5.1 mmol/L 3.7  Chloride 96 - 112 mmol/L 106  CO2 19 - 32 mmol/L 23  Calcium 8.4 - 10.5 mg/dL 9.7    Lab Results  Component Value Date   WBC 5.4 10/20/2019   HGB 11.2 (L) 10/20/2019   HCT 33.1 (L) 10/20/2019   MCV 81.5 10/20/2019   PLT 527 (H) 10/20/2019   NEUTROABS 3.1 10/20/2019    ASSESSMENT & PLAN:  Iron deficiency anemia due to chronic blood loss Microcytic anemia: Most likely diagnosis is iron deficiency anemia due to chronic heavy menstrual bleeding due to uterine fibroids. 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 I recommended another round of IV iron treatment.  Because she will get insurance very soon she would like to do the iron infusions in February. I would like to see her in  4 months with labs done couple of days ahead of time.  We will do a MyChart video visit.   No orders of the defined types were placed in this encounter.  The patient has a good understanding of the overall plan. she agrees with it. she will call with any problems that may develop before the next visit here.  Total time spent: 15 mins including face to face time and time spent for planning, charting and coordination of care  Nicholas Lose, MD 10/26/2019  I, Cloyde Reams Dorshimer, am acting as scribe for Dr. Nicholas Lose.  I have reviewed the above documentation for accuracy and completeness, and I agree with the above.

## 2019-10-26 ENCOUNTER — Inpatient Hospital Stay (HOSPITAL_BASED_OUTPATIENT_CLINIC_OR_DEPARTMENT_OTHER): Payer: Self-pay | Admitting: Hematology and Oncology

## 2019-10-26 ENCOUNTER — Other Ambulatory Visit: Payer: Self-pay

## 2019-10-26 DIAGNOSIS — D500 Iron deficiency anemia secondary to blood loss (chronic): Secondary | ICD-10-CM

## 2019-10-26 NOTE — Assessment & Plan Note (Signed)
Microcytic anemia: Most likely diagnosis is iron deficiency anemia due to chronic heavy menstrual bleeding due to uterine fibroids. 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 I recommended another round of IV iron treatment.

## 2019-10-27 ENCOUNTER — Telehealth: Payer: Self-pay | Admitting: Hematology and Oncology

## 2019-10-27 NOTE — Telephone Encounter (Signed)
I left a message regarding schedule  

## 2019-11-03 ENCOUNTER — Encounter: Payer: Self-pay | Admitting: Pharmacy Technician

## 2019-11-12 ENCOUNTER — Telehealth: Payer: Self-pay | Admitting: Hematology and Oncology

## 2019-11-12 NOTE — Telephone Encounter (Signed)
Called to rescheduled 2/5 appt, left a msg for pt to call back

## 2019-11-13 ENCOUNTER — Encounter: Payer: Self-pay | Admitting: Pharmacy Technician

## 2019-11-13 ENCOUNTER — Ambulatory Visit: Payer: Self-pay

## 2019-11-20 ENCOUNTER — Ambulatory Visit: Payer: Self-pay

## 2019-11-26 ENCOUNTER — Telehealth: Payer: Self-pay | Admitting: Emergency Medicine

## 2019-11-26 NOTE — Telephone Encounter (Signed)
Called pt for appt change, no answer.  LVM requesting call back to confirm new appt time of 0730 on 2/20 instead of 0730 on 2/19.

## 2019-11-26 NOTE — Telephone Encounter (Signed)
Called pt regarding appt change from 2/19 at 0730 to 2/20 at 0730, pt agreed to change in appt date/time.  Appt changed in pt's chart.  Pt requested that next three appts for IV iron that were scheduled for 0730 on Fridays be rescheduled to Friday afternoons if possible d/t work conflicts.  Scheduling message sent, pt aware that she will receive call rescheduling these appts.

## 2019-11-27 ENCOUNTER — Ambulatory Visit: Payer: Self-pay

## 2019-11-28 ENCOUNTER — Inpatient Hospital Stay: Payer: PRIVATE HEALTH INSURANCE | Attending: Hematology and Oncology

## 2019-11-30 ENCOUNTER — Telehealth: Payer: Self-pay | Admitting: Hematology and Oncology

## 2019-11-30 NOTE — Telephone Encounter (Signed)
Called pt per sch message - left message for patient to call back if reschedule is still needed for 2/26 appt .   Pt might needs another dose of iron infusion scheduled as well

## 2019-12-04 ENCOUNTER — Ambulatory Visit: Payer: Self-pay

## 2019-12-11 ENCOUNTER — Ambulatory Visit: Payer: Self-pay

## 2019-12-18 ENCOUNTER — Inpatient Hospital Stay: Payer: PRIVATE HEALTH INSURANCE | Attending: Hematology and Oncology

## 2019-12-23 ENCOUNTER — Telehealth: Payer: Self-pay | Admitting: Hematology and Oncology

## 2019-12-23 NOTE — Telephone Encounter (Signed)
Called pt per 3/17 sch message - unable to reach pt - left message for pt to call back to reschedule

## 2019-12-25 ENCOUNTER — Inpatient Hospital Stay: Payer: PRIVATE HEALTH INSURANCE

## 2020-01-08 ENCOUNTER — Inpatient Hospital Stay: Payer: PRIVATE HEALTH INSURANCE | Attending: Hematology and Oncology

## 2020-01-08 ENCOUNTER — Other Ambulatory Visit: Payer: Self-pay

## 2020-01-08 VITALS — BP 102/72 | HR 94 | Temp 98.3°F | Resp 17

## 2020-01-08 DIAGNOSIS — D500 Iron deficiency anemia secondary to blood loss (chronic): Secondary | ICD-10-CM

## 2020-01-08 DIAGNOSIS — Z79899 Other long term (current) drug therapy: Secondary | ICD-10-CM | POA: Diagnosis not present

## 2020-01-08 DIAGNOSIS — N920 Excessive and frequent menstruation with regular cycle: Secondary | ICD-10-CM | POA: Diagnosis not present

## 2020-01-08 MED ORDER — SODIUM CHLORIDE 0.9 % IV SOLN
INTRAVENOUS | Status: DC
  Filled 2020-01-08: qty 250

## 2020-01-08 MED ORDER — ACETAMINOPHEN 325 MG PO TABS
ORAL_TABLET | ORAL | Status: AC
  Filled 2020-01-08: qty 2

## 2020-01-08 MED ORDER — SODIUM CHLORIDE 0.9 % IV SOLN
200.0000 mg | Freq: Once | INTRAVENOUS | Status: AC
  Administered 2020-01-08: 200 mg via INTRAVENOUS
  Filled 2020-01-08: qty 200

## 2020-01-08 MED ORDER — ACETAMINOPHEN 325 MG PO TABS
650.0000 mg | ORAL_TABLET | Freq: Once | ORAL | Status: AC
  Administered 2020-01-08: 16:00:00 650 mg via ORAL

## 2020-01-08 NOTE — Progress Notes (Signed)
Patient declined to remain for 30 min post observation period. VSS.

## 2020-01-08 NOTE — Patient Instructions (Signed)

## 2020-01-15 ENCOUNTER — Inpatient Hospital Stay: Payer: PRIVATE HEALTH INSURANCE

## 2020-01-15 ENCOUNTER — Other Ambulatory Visit: Payer: Self-pay

## 2020-01-15 VITALS — BP 109/60 | HR 88 | Temp 99.1°F | Resp 18

## 2020-01-15 DIAGNOSIS — D500 Iron deficiency anemia secondary to blood loss (chronic): Secondary | ICD-10-CM | POA: Diagnosis not present

## 2020-01-15 MED ORDER — ACETAMINOPHEN 325 MG PO TABS
650.0000 mg | ORAL_TABLET | Freq: Once | ORAL | Status: AC
  Administered 2020-01-15: 16:00:00 650 mg via ORAL

## 2020-01-15 MED ORDER — SODIUM CHLORIDE 0.9 % IV SOLN
200.0000 mg | Freq: Once | INTRAVENOUS | Status: AC
  Administered 2020-01-15: 200 mg via INTRAVENOUS
  Filled 2020-01-15: qty 200

## 2020-01-15 MED ORDER — SODIUM CHLORIDE 0.9 % IV SOLN
INTRAVENOUS | Status: DC
  Filled 2020-01-15: qty 250

## 2020-01-15 MED ORDER — ACETAMINOPHEN 325 MG PO TABS
ORAL_TABLET | ORAL | Status: AC
  Filled 2020-01-15: qty 2

## 2020-01-15 NOTE — Patient Instructions (Signed)

## 2020-01-22 ENCOUNTER — Other Ambulatory Visit: Payer: Self-pay

## 2020-01-22 ENCOUNTER — Inpatient Hospital Stay: Payer: PRIVATE HEALTH INSURANCE

## 2020-01-22 VITALS — BP 110/78 | HR 96 | Temp 98.4°F | Resp 18

## 2020-01-22 DIAGNOSIS — D500 Iron deficiency anemia secondary to blood loss (chronic): Secondary | ICD-10-CM

## 2020-01-22 MED ORDER — SODIUM CHLORIDE 0.9 % IV SOLN
Freq: Once | INTRAVENOUS | Status: AC
  Filled 2020-01-22: qty 250

## 2020-01-22 MED ORDER — ACETAMINOPHEN 325 MG PO TABS
ORAL_TABLET | ORAL | Status: AC
  Filled 2020-01-22: qty 2

## 2020-01-22 MED ORDER — SODIUM CHLORIDE 0.9 % IV SOLN
200.0000 mg | Freq: Once | INTRAVENOUS | Status: AC
  Administered 2020-01-22: 200 mg via INTRAVENOUS
  Filled 2020-01-22: qty 200

## 2020-01-22 MED ORDER — ACETAMINOPHEN 325 MG PO TABS
650.0000 mg | ORAL_TABLET | Freq: Once | ORAL | Status: AC
  Administered 2020-01-22: 650 mg via ORAL

## 2020-01-22 NOTE — Patient Instructions (Signed)

## 2020-01-22 NOTE — Progress Notes (Signed)
Pt did not stay for observation. VSS

## 2020-01-29 ENCOUNTER — Inpatient Hospital Stay: Payer: PRIVATE HEALTH INSURANCE

## 2020-01-29 ENCOUNTER — Other Ambulatory Visit: Payer: Self-pay

## 2020-01-29 VITALS — BP 112/68 | HR 99 | Temp 99.3°F | Resp 18

## 2020-01-29 DIAGNOSIS — D500 Iron deficiency anemia secondary to blood loss (chronic): Secondary | ICD-10-CM

## 2020-01-29 MED ORDER — ACETAMINOPHEN 325 MG PO TABS
650.0000 mg | ORAL_TABLET | Freq: Once | ORAL | Status: AC
  Administered 2020-01-29: 650 mg via ORAL

## 2020-01-29 MED ORDER — SODIUM CHLORIDE 0.9 % IV SOLN
200.0000 mg | Freq: Once | INTRAVENOUS | Status: AC
  Administered 2020-01-29: 200 mg via INTRAVENOUS
  Filled 2020-01-29: qty 200

## 2020-01-29 MED ORDER — SODIUM CHLORIDE 0.9 % IV SOLN
INTRAVENOUS | Status: DC
  Filled 2020-01-29: qty 250

## 2020-01-29 MED ORDER — ACETAMINOPHEN 325 MG PO TABS
ORAL_TABLET | ORAL | Status: AC
  Filled 2020-01-29: qty 2

## 2020-01-29 NOTE — Patient Instructions (Signed)

## 2020-01-29 NOTE — Progress Notes (Signed)
Patient declined post Iron sucrose infusion observation.Verbal education provided.

## 2020-02-22 ENCOUNTER — Other Ambulatory Visit: Payer: Self-pay | Admitting: *Deleted

## 2020-02-22 DIAGNOSIS — D500 Iron deficiency anemia secondary to blood loss (chronic): Secondary | ICD-10-CM

## 2020-02-23 ENCOUNTER — Inpatient Hospital Stay: Payer: PRIVATE HEALTH INSURANCE | Attending: Hematology and Oncology

## 2020-02-23 DIAGNOSIS — D500 Iron deficiency anemia secondary to blood loss (chronic): Secondary | ICD-10-CM | POA: Insufficient documentation

## 2020-02-25 ENCOUNTER — Inpatient Hospital Stay: Payer: PRIVATE HEALTH INSURANCE | Admitting: Hematology and Oncology

## 2020-02-25 NOTE — Assessment & Plan Note (Deleted)
Microcytic anemia: Most likely diagnosis isiron deficiency anemia due tochronic heavy menstrual bleeding due to uterine fibroids. 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

## 2020-03-04 ENCOUNTER — Other Ambulatory Visit: Payer: Self-pay

## 2020-03-04 ENCOUNTER — Inpatient Hospital Stay: Payer: PRIVATE HEALTH INSURANCE

## 2020-03-04 DIAGNOSIS — D500 Iron deficiency anemia secondary to blood loss (chronic): Secondary | ICD-10-CM | POA: Diagnosis not present

## 2020-03-04 LAB — IRON AND TIBC
Iron: 61 ug/dL (ref 28–170)
Saturation Ratios: 13 % (ref 10.4–31.8)
TIBC: 457 ug/dL — ABNORMAL HIGH (ref 250–450)
UIBC: 396 ug/dL

## 2020-03-04 LAB — CBC WITH DIFFERENTIAL (CANCER CENTER ONLY)
Abs Immature Granulocytes: 0 10*3/uL (ref 0.00–0.07)
Basophils Absolute: 0 10*3/uL (ref 0.0–0.1)
Basophils Relative: 1 %
Eosinophils Absolute: 0.1 10*3/uL (ref 0.0–0.5)
Eosinophils Relative: 3 %
HCT: 34.3 % — ABNORMAL LOW (ref 36.0–46.0)
Hemoglobin: 11.3 g/dL — ABNORMAL LOW (ref 12.0–15.0)
Immature Granulocytes: 0 %
Lymphocytes Relative: 35 %
Lymphs Abs: 1.3 10*3/uL (ref 0.7–4.0)
MCH: 28.3 pg (ref 26.0–34.0)
MCHC: 32.9 g/dL (ref 30.0–36.0)
MCV: 85.8 fL (ref 80.0–100.0)
Monocytes Absolute: 0.3 10*3/uL (ref 0.1–1.0)
Monocytes Relative: 8 %
Neutro Abs: 2 10*3/uL (ref 1.7–7.7)
Neutrophils Relative %: 53 %
Platelet Count: 502 10*3/uL — ABNORMAL HIGH (ref 150–400)
RBC: 4 MIL/uL (ref 3.87–5.11)
RDW: 16.3 % — ABNORMAL HIGH (ref 11.5–15.5)
WBC Count: 3.8 10*3/uL — ABNORMAL LOW (ref 4.0–10.5)
nRBC: 0 % (ref 0.0–0.2)

## 2020-03-04 LAB — FERRITIN: Ferritin: 40 ng/mL (ref 11–307)

## 2020-03-09 NOTE — Progress Notes (Signed)
  HEMATOLOGY-ONCOLOGY MYCHART VIDEO VISIT PROGRESS NOTE  I connected with Erin Powell on 03/10/2020 at  3:00 PM EDT by MyChart video conference and verified that I am speaking with the correct person using two identifiers.  I discussed the limitations, risks, security and privacy concerns of performing an evaluation and management service by MyChart and the availability of in person appointments.  I also discussed with the patient that there may be a patient responsible charge related to this service. The patient expressed understanding and agreed to proceed.  Patient's Location: Home Physician Location: Clinic  CHIEF COMPLIANT: Follow-up of anemia and thrombocytosis  INTERVAL HISTORY: Erin Powell is a 39 y.o. female with above-mentioned history of thrombocytosis and iron deficiency anemia due to chronic heavy menstrual bleeding treated with IV iron (last 01/29/20). Labs on 03/04/20 showed Hg 11.3, HCT 34.1, platelets 502, iron saturation 13%, ferritin 40. She presents over MyChart today to review her labs.   She continues to suffer from fatigue.  Although with some things are slightly better.  Observations/Objective:  There were no vitals filed for this visit. There is no height or weight on file to calculate BMI.  I have reviewed the data as listed CMP Latest Ref Rng & Units 01/24/2015  Glucose 70 - 99 mg/dL 120(H)  BUN 6 - 23 mg/dL 9  Creatinine 0.50 - 1.10 mg/dL 0.82  Sodium 135 - 145 mmol/L 140  Potassium 3.5 - 5.1 mmol/L 3.7  Chloride 96 - 112 mmol/L 106  CO2 19 - 32 mmol/L 23  Calcium 8.4 - 10.5 mg/dL 9.7    Lab Results  Component Value Date   WBC 3.8 (L) 03/04/2020   HGB 11.3 (L) 03/04/2020   HCT 34.3 (L) 03/04/2020   MCV 85.8 03/04/2020   PLT 502 (H) 03/04/2020   NEUTROABS 2.0 03/04/2020      Assessment Plan:  Iron deficiency anemia due to chronic blood loss Microcytic anemia: Most likely diagnosis isiron deficiency anemia due tochronic heavy menstrual bleeding due  to uterine fibroids. 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  I discussed with her about taking over-the-counter B12 supplement sublingually.   Return to clinic in 3 months with labs and follow-up    I discussed the assessment and treatment plan with the patient. The patient was provided an opportunity to ask questions and all were answered. The patient agreed with the plan and demonstrated an understanding of the instructions. The patient was advised to call back or seek an in-person evaluation if the symptoms worsen or if the condition fails to improve as anticipated.   I provided 20 minutes of face-to-face MyChart video visit time during this encounter.    Rulon Eisenmenger, MD 03/10/2020   I, Molly Dorshimer, am acting as scribe for Nicholas Lose, MD.  I have reviewed the above documentation for accuracy and completeness, and I agree with the above.

## 2020-03-10 ENCOUNTER — Inpatient Hospital Stay: Payer: PRIVATE HEALTH INSURANCE | Attending: Hematology and Oncology | Admitting: Hematology and Oncology

## 2020-03-10 DIAGNOSIS — D500 Iron deficiency anemia secondary to blood loss (chronic): Secondary | ICD-10-CM | POA: Insufficient documentation

## 2020-03-10 NOTE — Assessment & Plan Note (Signed)
Microcytic anemia: Most likely diagnosis isiron deficiency anemia due tochronic heavy menstrual bleeding due to uterine fibroids. 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

## 2020-03-11 ENCOUNTER — Telehealth: Payer: Self-pay | Admitting: Hematology and Oncology

## 2020-03-11 NOTE — Telephone Encounter (Signed)
Scheduled per 06/03 los, patient has been called and voicemail was left.

## 2020-06-08 ENCOUNTER — Inpatient Hospital Stay: Payer: Self-pay | Attending: Hematology and Oncology

## 2020-06-08 DIAGNOSIS — D500 Iron deficiency anemia secondary to blood loss (chronic): Secondary | ICD-10-CM | POA: Insufficient documentation

## 2020-06-08 DIAGNOSIS — Z79899 Other long term (current) drug therapy: Secondary | ICD-10-CM | POA: Insufficient documentation

## 2020-06-08 DIAGNOSIS — D259 Leiomyoma of uterus, unspecified: Secondary | ICD-10-CM | POA: Insufficient documentation

## 2020-06-08 DIAGNOSIS — N924 Excessive bleeding in the premenopausal period: Secondary | ICD-10-CM | POA: Insufficient documentation

## 2020-06-10 ENCOUNTER — Inpatient Hospital Stay: Payer: Self-pay | Admitting: Hematology and Oncology

## 2020-06-10 NOTE — Assessment & Plan Note (Deleted)
Microcytic anemia: Most likely diagnosis isiron deficiency anemia due tochronic heavy menstrual bleeding due to uterine fibroids. 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  I discussed with her about taking over-the-counter B12 supplement sublingually.   Return to clinic in 3 months with labs and follow-up

## 2020-06-17 ENCOUNTER — Inpatient Hospital Stay: Payer: Self-pay

## 2020-06-17 ENCOUNTER — Other Ambulatory Visit: Payer: Self-pay

## 2020-06-17 DIAGNOSIS — D259 Leiomyoma of uterus, unspecified: Secondary | ICD-10-CM | POA: Diagnosis not present

## 2020-06-17 DIAGNOSIS — N924 Excessive bleeding in the premenopausal period: Secondary | ICD-10-CM | POA: Diagnosis not present

## 2020-06-17 DIAGNOSIS — D500 Iron deficiency anemia secondary to blood loss (chronic): Secondary | ICD-10-CM

## 2020-06-17 DIAGNOSIS — Z79899 Other long term (current) drug therapy: Secondary | ICD-10-CM | POA: Diagnosis not present

## 2020-06-17 LAB — CBC WITH DIFFERENTIAL (CANCER CENTER ONLY)
Abs Immature Granulocytes: 0.01 10*3/uL (ref 0.00–0.07)
Basophils Absolute: 0 10*3/uL (ref 0.0–0.1)
Basophils Relative: 0 %
Eosinophils Absolute: 0.1 10*3/uL (ref 0.0–0.5)
Eosinophils Relative: 2 %
HCT: 32.3 % — ABNORMAL LOW (ref 36.0–46.0)
Hemoglobin: 10.6 g/dL — ABNORMAL LOW (ref 12.0–15.0)
Immature Granulocytes: 0 %
Lymphocytes Relative: 33 %
Lymphs Abs: 1.5 10*3/uL (ref 0.7–4.0)
MCH: 27.4 pg (ref 26.0–34.0)
MCHC: 32.8 g/dL (ref 30.0–36.0)
MCV: 83.5 fL (ref 80.0–100.0)
Monocytes Absolute: 0.3 10*3/uL (ref 0.1–1.0)
Monocytes Relative: 6 %
Neutro Abs: 2.7 10*3/uL (ref 1.7–7.7)
Neutrophils Relative %: 59 %
Platelet Count: 371 10*3/uL (ref 150–400)
RBC: 3.87 MIL/uL (ref 3.87–5.11)
RDW: 15.4 % (ref 11.5–15.5)
WBC Count: 4.6 10*3/uL (ref 4.0–10.5)
nRBC: 0 % (ref 0.0–0.2)

## 2020-06-20 LAB — IRON AND TIBC
Iron: 56 ug/dL (ref 41–142)
Saturation Ratios: 14 % — ABNORMAL LOW (ref 21–57)
TIBC: 404 ug/dL (ref 236–444)
UIBC: 347 ug/dL (ref 120–384)

## 2020-06-20 LAB — FERRITIN: Ferritin: 7 ng/mL — ABNORMAL LOW (ref 11–307)

## 2020-06-20 NOTE — Progress Notes (Signed)
Patient Care Team: Lennie Odor, [place] as PCP - General (Nurse Practitioner)   MyChart virtual Visit: Verified identity and proceeded with the visit  DIAGNOSIS:    ICD-10-CM   1. Iron deficiency anemia due to chronic blood loss  D50.0     CHIEF COMPLIANT: Follow-up ofanemia and thrombocytosis  INTERVAL HISTORY: Erin Powell is a 39 y.o. with above-mentioned history of thrombocytosisand iron deficiencyanemiadue to chronic heavy menstrual bleeding treated with IV iron (last 01/29/20). Labs on 06/17/20 showed Hg 10.6, HCT 32.3, platelets 371, iron saturation 14%, ferritin 7.She presents to the clinic todayto review her labs. She has been feeling extremely tired.  She is also complaining of joint aches and stiffness and discomfort.  This has been going on for the past 2 months.  ALLERGIES:  has No Known Allergies.  MEDICATIONS:  Current Outpatient Medications  Medication Sig Dispense Refill  . diphenhydrAMINE (BENADRYL) 12.5 MG/5ML elixir Take 12.5 mg by mouth daily as needed for allergies.    Marland Kitchen ibuprofen (ADVIL,MOTRIN) 200 MG tablet Take 400 mg by mouth every 6 (six) hours as needed.    . meloxicam (MOBIC) 7.5 MG tablet Take 1 tablet (7.5 mg total) by mouth daily. 20 tablet 0  . methocarbamol (ROBAXIN) 500 MG tablet Take 1 tablet (500 mg total) by mouth 2 (two) times daily. 20 tablet 0  . Multiple Vitamin (MULTI-VITAMIN PO) Take 1 tablet by mouth daily.     No current facility-administered medications for this visit.    PHYSICAL EXAMINATION: ECOG PERFORMANCE STATUS: 1 - Symptomatic but completely ambulatory  There were no vitals filed for this visit. There were no vitals filed for this visit.  LABORATORY DATA:  I have reviewed the data as listed CMP Latest Ref Rng & Units 01/24/2015  Glucose 70 - 99 mg/dL 120(H)  BUN 6 - 23 mg/dL 9  Creatinine 0.50 - 1.10 mg/dL 0.82  Sodium 135 - 145 mmol/L 140  Potassium 3.5 - 5.1 mmol/L 3.7  Chloride 96 - 112 mmol/L 106  CO2 19 - 32  mmol/L 23  Calcium 8.4 - 10.5 mg/dL 9.7    Lab Results  Component Value Date   WBC 4.6 06/17/2020   HGB 10.6 (L) 06/17/2020   HCT 32.3 (L) 06/17/2020   MCV 83.5 06/17/2020   PLT 371 06/17/2020   NEUTROABS 2.7 06/17/2020    ASSESSMENT & PLAN:  Iron deficiency anemia due to chronic blood loss Microcytic anemia: Most likely diagnosis isiron deficiency anemia due tochronic heavy menstrual bleeding due to uterine fibroids. 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 Severe fatigue: Probably multifactorial including iron deficiency anemia.  Recommended that she take a vitamin D and a B12 supplement as well.   Patient wants to get pregnant.  I discussed with her that her iron needs may increase during pregnancy and that we will watch and monitor her closely. Return to clinic in 3 enough months with labs done couple of days ahead of time via MyChart virtual visit.  No orders of the defined types were placed in this encounter.  The patient has a good understanding of the overall plan. she agrees with it. she  will call with any problems that may develop before the next visit here.  Total time spent: 20 mins including face to face time and time spent for planning, charting and coordination of care  Nicholas Lose, MD 06/21/2020  I, Cloyde Reams Dorshimer, am acting as scribe for Dr. Nicholas Lose.  I have reviewed the above documentation for accuracy and completeness, and I agree with the above.

## 2020-06-21 ENCOUNTER — Inpatient Hospital Stay (HOSPITAL_BASED_OUTPATIENT_CLINIC_OR_DEPARTMENT_OTHER): Payer: Self-pay | Admitting: Hematology and Oncology

## 2020-06-21 ENCOUNTER — Telehealth: Payer: Self-pay | Admitting: Hematology and Oncology

## 2020-06-21 DIAGNOSIS — D500 Iron deficiency anemia secondary to blood loss (chronic): Secondary | ICD-10-CM | POA: Diagnosis not present

## 2020-06-21 NOTE — Telephone Encounter (Signed)
Scheduled per 9/14 los. Called and left a msg, mailing appt letter and calendar printout

## 2020-06-21 NOTE — Assessment & Plan Note (Signed)
Microcytic anemia: Most likely diagnosis isiron deficiency anemia due tochronic heavy menstrual bleeding due to uterine fibroids. 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 Severe fatigue: Probably multifactorial including iron deficiency anemia. Recheck labs in 6 months and follow-up after that

## 2020-06-24 ENCOUNTER — Ambulatory Visit: Payer: Self-pay

## 2020-06-27 ENCOUNTER — Inpatient Hospital Stay: Payer: Self-pay

## 2020-06-29 ENCOUNTER — Telehealth: Payer: Self-pay | Admitting: Hematology and Oncology

## 2020-06-29 ENCOUNTER — Inpatient Hospital Stay: Payer: Self-pay

## 2020-06-29 ENCOUNTER — Other Ambulatory Visit: Payer: Self-pay

## 2020-06-29 VITALS — BP 127/84 | HR 89 | Temp 98.6°F | Resp 18

## 2020-06-29 DIAGNOSIS — D500 Iron deficiency anemia secondary to blood loss (chronic): Secondary | ICD-10-CM | POA: Diagnosis not present

## 2020-06-29 MED ORDER — SODIUM CHLORIDE 0.9 % IV SOLN
510.0000 mg | Freq: Once | INTRAVENOUS | Status: AC
  Administered 2020-06-29: 510 mg via INTRAVENOUS
  Filled 2020-06-29: qty 17

## 2020-06-29 MED ORDER — SODIUM CHLORIDE 0.9 % IV SOLN
Freq: Once | INTRAVENOUS | Status: AC
  Filled 2020-06-29: qty 250

## 2020-06-29 NOTE — Progress Notes (Signed)
Patient declined to stay for 30 minute observation post infusion. VSS at discharge.

## 2020-06-29 NOTE — Patient Instructions (Signed)

## 2020-06-30 ENCOUNTER — Inpatient Hospital Stay: Payer: Self-pay

## 2020-07-04 ENCOUNTER — Inpatient Hospital Stay: Payer: Self-pay

## 2020-07-05 NOTE — Progress Notes (Signed)
Patient assistance for Feraheme from [employer] is denied. Program requires Welcome call by patient to be completed. Pt contacted multiple times by program no response. Unable to process further until patient contacts [employer] @ [phone].

## 2020-07-07 NOTE — Progress Notes (Addendum)
The following Medication: Shirlean Kelly has been approved thru [employer]. Approved for 2 doses on 07/06/20.  Assistance ID: [id number] . Medication is ordered as Assistance to have on hand prior to treatment. First DOS: 06/29/20  Next DOS: *pt no showed last appt's.  **additional doses, require Patient Financials to be for processing.

## 2020-07-13 ENCOUNTER — Other Ambulatory Visit: Payer: Self-pay | Admitting: Hematology and Oncology

## 2020-07-14 ENCOUNTER — Telehealth: Payer: Self-pay | Admitting: Hematology and Oncology

## 2020-07-14 NOTE — Telephone Encounter (Signed)
Scheduled appt per 10/6 sch msg -- unable to reach pt . Left message with appt date and time

## 2020-07-18 ENCOUNTER — Inpatient Hospital Stay: Payer: PRIVATE HEALTH INSURANCE

## 2020-07-21 ENCOUNTER — Other Ambulatory Visit: Payer: Self-pay

## 2020-07-21 ENCOUNTER — Emergency Department (HOSPITAL_COMMUNITY): Payer: Self-pay

## 2020-07-21 ENCOUNTER — Emergency Department (HOSPITAL_COMMUNITY)
Admission: EM | Admit: 2020-07-21 | Discharge: 2020-07-22 | Disposition: A | Discharge status: Home / Self Care | Payer: Self-pay | Attending: Emergency Medicine | Admitting: Emergency Medicine

## 2020-07-21 ENCOUNTER — Encounter (HOSPITAL_COMMUNITY): Payer: Self-pay | Admitting: Emergency Medicine

## 2020-07-21 DIAGNOSIS — O200 Threatened abortion: Secondary | ICD-10-CM | POA: Insufficient documentation

## 2020-07-21 DIAGNOSIS — Z3A01 Less than 8 weeks gestation of pregnancy: Secondary | ICD-10-CM | POA: Insufficient documentation

## 2020-07-21 DIAGNOSIS — O209 Hemorrhage in early pregnancy, unspecified: Secondary | ICD-10-CM

## 2020-07-21 LAB — CBC
HCT: 32.3 % — ABNORMAL LOW (ref 36.0–46.0)
Hemoglobin: 10.8 g/dL — ABNORMAL LOW (ref 12.0–15.0)
MCH: 28.8 pg (ref 26.0–34.0)
MCHC: 33.4 g/dL (ref 30.0–36.0)
MCV: 86.1 fL (ref 80.0–100.0)
Platelets: 382 10*3/uL (ref 150–400)
RBC: 3.75 MIL/uL — ABNORMAL LOW (ref 3.87–5.11)
RDW: 14.6 % (ref 11.5–15.5)
WBC: 4.5 10*3/uL (ref 4.0–10.5)
nRBC: 0 % (ref 0.0–0.2)

## 2020-07-21 LAB — COMPREHENSIVE METABOLIC PANEL
ALT: 21 U/L (ref 0–44)
AST: 23 U/L (ref 15–41)
Albumin: 4 g/dL (ref 3.5–5.0)
Alkaline Phosphatase: 52 U/L (ref 38–126)
Anion gap: 9 (ref 5–15)
BUN: 8 mg/dL (ref 6–20)
CO2: 21 mmol/L — ABNORMAL LOW (ref 22–32)
Calcium: 8.9 mg/dL (ref 8.9–10.3)
Chloride: 109 mmol/L (ref 98–111)
Creatinine, Ser: 0.69 mg/dL (ref 0.44–1.00)
GFR, Estimated: >60 mL/min (ref >60)
Glucose, Bld: 146 mg/dL — ABNORMAL HIGH (ref 70–99)
Potassium: 3 mmol/L — ABNORMAL LOW (ref 3.5–5.1)
Sodium: 139 mmol/L (ref 135–145)
Total Bilirubin: 0.5 mg/dL (ref 0.3–1.2)
Total Protein: 7.1 g/dL (ref 6.5–8.1)

## 2020-07-21 LAB — TYPE AND SCREEN
ABO/RH(D): O POS
Antibody Screen: NEGATIVE

## 2020-07-21 LAB — HCG, QUANTITATIVE, PREGNANCY: hCG, Beta Chain, Quant, S: 43 m[IU]/mL — ABNORMAL HIGH (ref <5)

## 2020-07-21 LAB — LIPASE, BLOOD: Lipase: 38 U/L (ref 11–51)

## 2020-07-21 LAB — I-STAT BETA HCG BLOOD, ED (MC, WL, AP ONLY): I-stat hCG, quantitative: 45.5 m[IU]/mL — ABNORMAL HIGH (ref <5)

## 2020-07-21 NOTE — ED Triage Notes (Addendum)
Patient states she is [redacted] weeks pregnant and bleeding. Patient states this started around 7 pm. Patient states she is bleeding like she is on her period and is passing clots. Abdominal pain started two weeks ago.

## 2020-07-22 NOTE — ED Provider Notes (Signed)
[facility] DEPT Provider Note   CSN: [id number] Arrival date & time: 07/21/20  2040     History Chief Complaint  Patient presents with  . Vaginal Bleeding    [redacted] weeks pregnant  . Abdominal Pain    Erin Powell is a 39 y.o. female.  39 year old female with prior medical history as detailed below presents for evaluation.  Patient reports that she is approximately 5 to [redacted] weeks pregnant.  Patient reports that she had positive pregnancy test at home.  Patient reports vaginal bleeding that started earlier today.  Patient denies fever.    The history is provided by the patient and medical records.  Vaginal Bleeding Quality:  Typical of menses Severity:  Moderate Onset quality:  Gradual Duration:  12 hours Timing:  Rare Progression:  Waxing and waning Chronicity:  New Associated symptoms: abdominal pain   Abdominal Pain Associated symptoms: vaginal bleeding        Past Medical History:  Diagnosis Date  . Allergy     Patient Active Problem List   Diagnosis Date Noted  . Iron deficiency anemia due to chronic blood loss 07/21/2019    History reviewed. No pertinent surgical history.   OB History   No obstetric history on file.     Family History  Problem Relation Age of Onset  . Diabetes Maternal Grandmother   . Diabetes Paternal Grandmother     Social History   Tobacco Use  . Smoking status: Never Smoker  . Smokeless tobacco: Never Used  Vaping Use  . Vaping Use: Never used  Substance Use Topics  . Alcohol use: No    Alcohol/week: 0.0 standard drinks  . Drug use: No    Home Medications Prior to Admission medications   Medication Sig Start Date End Date Taking? Authorizing Provider  ferrous sulfate 325 (65 FE) MG tablet Take 325 mg by mouth daily with breakfast.   Yes [provider]  ibuprofen (ADVIL,MOTRIN) 200 MG tablet Take 400 mg by mouth every 6 (six) hours as needed for moderate pain.    Yes [provider]  Multiple Vitamin (MULTI-VITAMIN PO) Take 1 tablet by mouth daily.   Yes [provider]  meloxicam (MOBIC) 7.5 MG tablet Take 1 tablet (7.5 mg total) by mouth daily. Patient not taking: Reported on 07/21/2020 01/24/15   Fransico Meadow, PA-C  methocarbamol (ROBAXIN) 500 MG tablet Take 1 tablet (500 mg total) by mouth 2 (two) times daily. Patient not taking: Reported on 07/21/2020 01/24/15   Fransico Meadow, PA-C    Allergies    Patient has no known allergies.  Review of Systems   Review of Systems  Gastrointestinal: Positive for abdominal pain.  Genitourinary: Positive for vaginal bleeding.  All other systems reviewed and are negative.   Physical Exam Updated Vital Signs BP (!) 116/94 (BP Location: Left Arm)   Pulse 86   Temp 99.5 F (37.5 C) (Oral)   Resp 18   Ht 5\' 4"  (1.626 m)   Wt 62.1 kg   SpO2 100%   BMI 23.52 kg/m   Physical Exam Vitals and nursing note reviewed.  Constitutional:      General: She is not in acute distress.    Appearance: She is well-developed.  HENT:     Head: Normocephalic and atraumatic.  Eyes:     Conjunctiva/sclera: Conjunctivae normal.     Pupils: Pupils are equal, round, and reactive to light.  Cardiovascular:     Rate and  Rhythm: Normal rate and regular rhythm.     Heart sounds: Normal heart sounds.  Pulmonary:     Effort: Pulmonary effort is normal. No respiratory distress.     Breath sounds: Normal breath sounds.  Abdominal:     General: There is no distension.     Palpations: Abdomen is soft.     Tenderness: There is no abdominal tenderness.  Genitourinary:    Comments: Declined pelvic  Musculoskeletal:        General: No deformity. Normal range of motion.     Cervical back: Normal range of motion and neck supple.  Skin:    General: Skin is warm and dry.  Neurological:     Mental Status: She is alert and oriented to person, place, and time.     ED Results / Procedures / Treatments   Labs (all  labs ordered are listed, but only abnormal results are displayed) Labs Reviewed  COMPREHENSIVE METABOLIC PANEL - Abnormal; Notable for the following components:      Result Value   Potassium 3.0 (*)    CO2 21 (*)    Glucose, Bld 146 (*)    All other components within normal limits  CBC - Abnormal; Notable for the following components:   RBC 3.75 (*)    Hemoglobin 10.8 (*)    HCT 32.3 (*)    All other components within normal limits  HCG, QUANTITATIVE, PREGNANCY - Abnormal; Notable for the following components:   hCG, Beta Chain, Quant, S 43 (*)    All other components within normal limits  I-STAT BETA HCG BLOOD, ED ([facility], [facility], [facility] ONLY) - Abnormal; Notable for the following components:   I-stat hCG, quantitative 45.5 (*)    All other components within normal limits  LIPASE, BLOOD  URINALYSIS, ROUTINE W REFLEX MICROSCOPIC  TYPE AND SCREEN  ABO/RH    EKG None  Radiology US OB LESS THAN 14 WEEKS W/ OB TRANSVAGINAL AND DOPPLER  Result Date: 07/22/2020 CLINICAL DATA:  Last menstrual period 5 weeks ago. Beta HCG 43 quantitative per ED doc. (positive urine test are home). Cramping/vaginal bleeding symptoms. Known multiple fibroids. EXAM: TRANSABDOMINAL AND TRANSVAGINAL ULTRASOUND OF PELVIS TECHNIQUE: Both transabdominal and transvaginal ultrasound examinations of the pelvis were performed. Transabdominal technique was performed for global imaging of the pelvis including uterus, ovaries, adnexal regions, and pelvic cul-de-sac. It was necessary to proceed with endovaginal exam following the transabdominal exam to visualize the endometrium. COMPARISON:  Ultrasound pelvic 02/09/2019 FINDINGS: Uterus No gestational sac identified. Measurements: 11 x 6.8 x 10.2 cm = volume: 403 mL. Total of 3 heterogeneous uterine lesions identified demonstrating Venetian blind appearance consistent with there fibroids. There is 1 anterior left intramural fibroid measuring 2.4 x 2.2 x 2.5 cm (numbered fibroid #2).  There is a submucosal fibroid measuring 2.7 x 2.6 x 2.1 cm (numbered fibroid #1). There is another submucosal fibroid subjacent to the first one measuring 2.7 x 2.6 x 2.1 cm (numbered fibroid #3). Endometrium Thickness: 10mm. Thickened and distorted by 2 submucosal fibroids as described above. Right ovary Measurements: 1.8 x 1.4 x 2.1cm = volume: 2.9 mL. Normal appearance/no adnexal mass. Left ovary Measurements: 2.4 x 1.7 x 2.8 = volume: 5.9 mL. Normal appearance/no adnexal mass. Other findings No abnormal free fluid. IMPRESSION: 1. No gestational sac visualized. No intrauterine or ectopic pregnancy identified. Findings could represent a very early pregnancy versus a failed pregnancy given Beta-HCG of 43 and reported cramping/vaginal bleeding symptoms. Recommend trending of beta HCG and if clinically  indicated a follow-up pelvic ultrasound. If desired pregnancy, recommend a non-emergent obstetric consultation for discussion of a geriatric pregnancy. 2. Total of three uterine fibroids including two submucosal fibroids with a thickened and distorted endometrium measuring up to 28 mm. Recommend a non-emergent gynecologic consultation. These results were called by telephone at the time of interpretation on 07/21/2020 at 11:58 pm to provider [facility] , who verbally acknowledged these results. Electronically Signed   By: Iven Finn M.D.   On: 07/22/2020 00:07    Procedures Procedures (including critical care time)  Medications Ordered in ED Medications - No data to display  ED Course  I have reviewed the triage vital signs and the nursing notes.  Pertinent labs & imaging results that were available during my care of the patient were reviewed by me and considered in my medical decision making (see chart for details).    MDM Rules/Calculators/A&P                          MDM  Screen complete  Erin Powell was evaluated in Emergency Department on 07/22/2020 for the symptoms described in the  history of present illness. She was evaluated in the context of the global COVID-19 pandemic, which necessitated consideration that the patient might be at risk for infection with the SARS-CoV-2 virus that causes COVID-19. Institutional protocols and algorithms that pertain to the evaluation of patients at risk for COVID-19 are in a state of rapid change based on information released by regulatory bodies including the CDC and federal and state organizations. These policies and algorithms were followed during the patient's care in the ED.  Patient presented for evaluation of reported vaginal bleeding in the setting of possible pregnancy.  Work-up reveals very low hCG in the 40s.  Ultrasound did not demonstrate evidence of IUP or ectopic.  Patient does understand need for close follow-up.  Patient's presentation is most consistent with likely miscarriage in progress.  Strict return precautions given and understood. Importance of close FU is stressed.   Final Clinical Impression(s) / ED Diagnoses Final diagnoses:  Threatened miscarriage    Rx / DC Orders ED Discharge Orders         Ordered    Ambulatory referral to Obstetrics / Gynecology       Comments: Likely early Miscarriage -- needs FU   07/22/20 0010           Valarie Merino, MD 07/22/20 [phone]

## 2020-07-22 NOTE — ED Notes (Signed)
Discharged no concerns 

## 2020-07-22 NOTE — Discharge Instructions (Signed)
Please return for any problem.  °

## 2020-07-23 ENCOUNTER — Other Ambulatory Visit: Payer: Self-pay

## 2020-07-23 ENCOUNTER — Encounter (HOSPITAL_COMMUNITY): Payer: Self-pay | Admitting: Obstetrics & Gynecology

## 2020-07-23 ENCOUNTER — Inpatient Hospital Stay (HOSPITAL_COMMUNITY)
Admission: AD | Admit: 2020-07-23 | Discharge: 2020-07-24 | Disposition: A | Discharge status: Home / Self Care | Payer: PRIVATE HEALTH INSURANCE | Attending: Obstetrics & Gynecology | Admitting: Obstetrics & Gynecology

## 2020-07-23 DIAGNOSIS — Z3A01 Less than 8 weeks gestation of pregnancy: Secondary | ICD-10-CM | POA: Insufficient documentation

## 2020-07-23 DIAGNOSIS — O0281 Inappropriate change in quantitative human chorionic gonadotropin (hCG) in early pregnancy: Secondary | ICD-10-CM | POA: Insufficient documentation

## 2020-07-23 HISTORY — DX: Anemia, unspecified: D64.9

## 2020-07-23 HISTORY — DX: Benign neoplasm of connective and other soft tissue, unspecified: D21.9

## 2020-07-23 NOTE — MAU Provider Note (Signed)
First Provider Initiated Contact with Patient 07/23/20 2344      S Ms. Benisha Hadaway is a 39 y.o. G3P1011 patient who presents to MAU today for repeat hCG. She was seen on Oct 14th at [facility] for vaginal bleeding.  Patient states the bleeding was more than spotting, but not like her normal period. She denies current pain or discomfort.   O BP 118/82 (BP Location: Left Arm)   Pulse 91   Temp 98.8 F (37.1 C) (Oral)   Resp 18   LMP 06/12/2020 (Exact Date)   SpO2 100%  Physical Exam Constitutional:      Appearance: Normal appearance.  HENT:     Head: Normocephalic and atraumatic.  Eyes:     Conjunctiva/sclera: Conjunctivae normal.  Cardiovascular:     Rate and Rhythm: Normal rate and regular rhythm.  Pulmonary:     Effort: Pulmonary effort is normal.  Musculoskeletal:        General: Normal range of motion.     Cervical back: Normal range of motion.  Neurological:     Mental Status: She is alert and oriented to person, place, and time.  Psychiatric:        Mood and Affect: Mood normal.        Behavior: Behavior normal.        Thought Content: Thought content normal.    Results for orders placed or performed during the hospital encounter of 07/23/20 (from the past 24 hour(s))  hCG, quantitative, pregnancy     Status: Abnormal   Collection Time: 07/23/20 11:51 PM  Result Value Ref Range   hCG, Beta Chain, Quant, S 32 (H) <5 mIU/mL  CBC     Status: Abnormal   Collection Time: 07/23/20 11:51 PM  Result Value Ref Range   WBC 6.9 4.0 - 10.5 K/uL   RBC 3.92 3.87 - 5.11 MIL/uL   Hemoglobin 11.2 (L) 12.0 - 15.0 g/dL   HCT 33.3 (L) 36 - 46 %   MCV 84.9 80.0 - 100.0 fL   MCH 28.6 26.0 - 34.0 pg   MCHC 33.6 30.0 - 36.0 g/dL   RDW 14.4 11.5 - 15.5 %   Platelets 377 150 - 400 K/uL   nRBC 0.0 0.0 - 0.2 %  Comprehensive metabolic panel     Status: Abnormal   Collection Time: 07/23/20 11:51 PM  Result Value Ref Range   Sodium 139 135 - 145 mmol/L   Potassium 3.1 (L) 3.5 -  5.1 mmol/L   Chloride 105 98 - 111 mmol/L   CO2 24 22 - 32 mmol/L   Glucose, Bld 96 70 - 99 mg/dL   BUN 9 6 - 20 mg/dL   Creatinine, Ser 0.93 0.44 - 1.00 mg/dL   Calcium 9.0 8.9 - 10.3 mg/dL   Total Protein 6.8 6.5 - 8.1 g/dL   Albumin 4.0 3.5 - 5.0 g/dL   AST 22 15 - 41 U/L   ALT 19 0 - 44 U/L   Alkaline Phosphatase 56 38 - 126 U/L   Total Bilirubin 0.3 0.3 - 1.2 mg/dL   GFR, Estimated >60 >60 mL/min   Anion gap 10 5 - 15    A F/U hCG Medical screening exam complete   P Reviewed POC to include: *Repeat hCG, CBC, and CMP today. *Discussed waiting for results as patient has no active mychart. -Patient agreeable. -Labs ordered and drawn.   Gavin Pound, [place] 07/23/2020 11:44 PM   Reassessment (1:20 AM)  -hCG returns at  32 which is a 25% decrease from draw on 10/14. -Patient informed that findings suspicious for ectopic, but more likely suggestive of miscarriage. -Patient appropriately tearful.  Comforting words expressed.   -Patient questions what likely caused miscarriage and informed that causes are unknown. -Discussed provider desire to follow up with additional hCG in 48 hours at [facility] location. -Patient agreeable and scheduled for 0800 on Tuesday 10/19 at [place] to contact this provider with results-Message placed in chart.  -Precautions Given. -Encouraged to call or return to MAU if symptoms worsen or with the onset of new symptoms. -Discharged to home in stable condition.  Maryann Conners MSN, CNM Advanced Practice Provider, [facility]

## 2020-07-23 NOTE — MAU Note (Signed)
Pt reports to MAU stating she was seen in the ED on Thursday night and was told to come to MAU tonight for repeat HCG. Pt reports her vaginal bleeding is lighter than that visit and her abdominal cramping has subsided.

## 2020-07-24 LAB — COMPREHENSIVE METABOLIC PANEL
ALT: 19 U/L (ref 0–44)
AST: 22 U/L (ref 15–41)
Albumin: 4 g/dL (ref 3.5–5.0)
Alkaline Phosphatase: 56 U/L (ref 38–126)
Anion gap: 10 (ref 5–15)
BUN: 9 mg/dL (ref 6–20)
CO2: 24 mmol/L (ref 22–32)
Calcium: 9 mg/dL (ref 8.9–10.3)
Chloride: 105 mmol/L (ref 98–111)
Creatinine, Ser: 0.93 mg/dL (ref 0.44–1.00)
GFR, Estimated: >60 mL/min (ref >60)
Glucose, Bld: 96 mg/dL (ref 70–99)
Potassium: 3.1 mmol/L — ABNORMAL LOW (ref 3.5–5.1)
Sodium: 139 mmol/L (ref 135–145)
Total Bilirubin: 0.3 mg/dL (ref 0.3–1.2)
Total Protein: 6.8 g/dL (ref 6.5–8.1)

## 2020-07-24 LAB — CBC
HCT: 33.3 % — ABNORMAL LOW (ref 36.0–46.0)
Hemoglobin: 11.2 g/dL — ABNORMAL LOW (ref 12.0–15.0)
MCH: 28.6 pg (ref 26.0–34.0)
MCHC: 33.6 g/dL (ref 30.0–36.0)
MCV: 84.9 fL (ref 80.0–100.0)
Platelets: 377 10*3/uL (ref 150–400)
RBC: 3.92 MIL/uL (ref 3.87–5.11)
RDW: 14.4 % (ref 11.5–15.5)
WBC: 6.9 10*3/uL (ref 4.0–10.5)
nRBC: 0 % (ref 0.0–0.2)

## 2020-07-24 LAB — HCG, QUANTITATIVE, PREGNANCY: hCG, Beta Chain, Quant, S: 32 m[IU]/mL — ABNORMAL HIGH (ref <5)

## 2020-07-24 NOTE — Discharge Instructions (Signed)
Ectopic Pregnancy ° °An ectopic pregnancy is when the fertilized egg attaches (implants) outside the uterus. Most ectopic pregnancies occur in one of the tubes where eggs travel from the ovary to the uterus (fallopian tubes), but the implanting can occur in other locations. In rare cases, ectopic pregnancies occur on the ovary, intestine, pelvis, abdomen, or cervix. In an ectopic pregnancy, the fertilized egg does not have the ability to develop into a normal, healthy baby. °A ruptured ectopic pregnancy is one in which tearing or bursting of a fallopian tube causes internal bleeding. Often, there is intense lower abdominal pain, and vaginal bleeding sometimes occurs. Having an ectopic pregnancy can be life-threatening. If this dangerous condition is not treated, it can lead to blood loss, shock, or even death. °What are the causes? °The most common cause of this condition is damage to one of the fallopian tubes. A fallopian tube may be narrowed or blocked, and that keeps the fertilized egg from reaching the uterus. °What increases the risk? °This condition is more likely to develop in women of childbearing age who have different levels of risk. The levels of risk can be divided into three categories. °High risk °· You have gone through infertility treatment. °· You have had an ectopic pregnancy before. °· You have had surgery on the fallopian tubes, or another surgical procedure, such as an abortion. °· You have had surgery to have the fallopian tubes tied (tubal ligation). °· You have problems or diseases of the fallopian tubes. °· You have been exposed to diethylstilbestrol (DES). This medicine was used until 1971, and it had effects on babies whose mothers took the medicine. °· You become pregnant while using an IUD (intrauterine device) for birth control. °Moderate risk °· You have a history of infertility. °· You have had an STI (sexually transmitted infection). °· You have a history of pelvic inflammatory  disease (PID). °· You have scarring from endometriosis. °· You have multiple sexual partners. °· You smoke. °Low risk °· You have had pelvic surgery. °· You use vaginal douches. °· You became sexually active before age 18. °What are the signs or symptoms? °Common symptoms of this condition include normal pregnancy symptoms, such as missing a period, nausea, tiredness, abdominal pain, breast tenderness, and bleeding. However, ectopic pregnancy will have additional symptoms, such as: °· Pain with intercourse. °· Irregular vaginal bleeding or spotting. °· Cramping or pain on one side or in the lower abdomen. °· Fast heartbeat, low blood pressure, and sweating. °· Passing out while having a bowel movement. °Symptoms of a ruptured ectopic pregnancy and internal bleeding may include: °· Sudden, severe pain in the abdomen and pelvis. °· Dizziness, weakness, light-headedness, or fainting. °· Pain in the shoulder or neck area. °How is this diagnosed? °This condition is diagnosed by: °· A pelvic exam to locate pain or a mass in the abdomen. °· A pregnancy test. This blood test checks for the presence as well as the specific level of pregnancy hormone in the bloodstream. °· Ultrasound. This is performed if a pregnancy test is positive. In this test, a probe is inserted into the vagina. The probe will detect a fetus, possibly in a location other than the uterus. °· Taking a sample of uterus tissue (dilation and curettage, or D&C). °· Surgery to perform a visual exam of the inside of the abdomen using a thin, lighted tube that has a tiny camera on the end (laparoscope). °· Culdocentesis. This procedure involves inserting a needle at the top of   the vagina, behind the uterus. If blood is present in this area, it may indicate that a fallopian tube is torn. How is this treated? This condition is treated with medicine or surgery. Medicine  An injection of a medicine (methotrexate) may be given to cause the pregnancy tissue to be  absorbed. This medicine may save your fallopian tube. It may be given if: ? The diagnosis is made early, with no signs of active bleeding. ? The fallopian tube has not ruptured. ? You are considered to be a good candidate for the medicine. Usually, pregnancy hormone blood levels are checked after methotrexate treatment. This is to be sure that the medicine is effective. It may take 4-6 weeks for the pregnancy to be absorbed. Most pregnancies will be absorbed by 3 weeks. Surgery  A laparoscope may be used to remove the pregnancy tissue.  If severe internal bleeding occurs, a larger cut (incision) may be made in the lower abdomen (laparotomy) to remove the fetus and placenta. This is done to stop the bleeding.  Part or all of the fallopian tube may be removed (salpingectomy) along with the fetus and placenta. The fallopian tube may also be repaired during the surgery.  In very rare circumstances, removal of the uterus (hysterectomy) may be required.  After surgery, pregnancy hormone testing may be done to be sure that there is no pregnancy tissue left. Whether your treatment is medicine or surgery, you may receive a Rho (D) immune globulin shot to prevent problems with any future pregnancy. This shot may be given if:  You are Rh-negative and the baby's father is Rh-positive.  You are Rh-negative and you do not know the Rh type of the baby's father. Follow these instructions at home:  Rest and limit your activity after the procedure for as long as told by your health care provider.  Until your health care provider says that it is safe: ? Do not lift anything that is heavier than 10 lb (4.5 kg), or the limit that your health care provider tells you. ? Avoid physical exercise and any movement that requires effort (is strenuous).  To help prevent constipation: ? Eat a healthy diet that includes fruits, vegetables, and whole grains. ? Drink 6-8 glasses of water per day. Get help right away  if:  You develop worsening pain that is not relieved by medicine.  You have: ? A fever or chills. ? Vaginal bleeding. ? Redness and swelling at the incision site. ? Nausea and vomiting.  You feel dizzy or weak.  You feel light-headed or you faint. This information is not intended to replace advice given to you by your health care provider. Make sure you discuss any questions you have with your health care provider. Document Revised: 09/06/2017 Document Reviewed: 04/25/2016 Elsevier Patient Education  [place].

## 2020-07-26 ENCOUNTER — Ambulatory Visit (INDEPENDENT_AMBULATORY_CARE_PROVIDER_SITE_OTHER): Payer: Self-pay

## 2020-07-26 ENCOUNTER — Telehealth: Payer: Self-pay

## 2020-07-26 ENCOUNTER — Other Ambulatory Visit: Payer: Self-pay

## 2020-07-26 DIAGNOSIS — O3680X0 Pregnancy with inconclusive fetal viability, not applicable or unspecified: Secondary | ICD-10-CM

## 2020-07-26 DIAGNOSIS — O0281 Inappropriate change in quantitative human chorionic gonadotropin (hCG) in early pregnancy: Secondary | ICD-10-CM

## 2020-07-26 LAB — BETA HCG QUANT (REF LAB): hCG Quant: 23 m[IU]/mL

## 2020-07-26 NOTE — Telephone Encounter (Signed)
LVM for pt to c/b regarding STAT HCG results

## 2020-07-26 NOTE — Progress Notes (Signed)
History   Chief Complaint:  STAT HCG   Erin Powell is  39 y.o. G3P1011 Patient's last menstrual period was 06/12/2020 (exact date).. Patient is here for follow up of quantitative HCG and ongoing surveillance of pregnancy status.   She is [redacted]w[redacted]d weeks gestation by LMP.    Since her last visit, the patient is without new complaint.   The patient reports bleeding as similar to period with mild cramping.    Her previous Quantitative HCG values are:  07/21/20 - HCG 43 07/23/20 - HCG 32   Assessment:  [redacted]w[redacted]d weeks gestation here for ongoing surveillance of pregnancy.    Plan: Will contact patient with a plan once STAT HCG results are reviewed with the provider today   ----Results reviewed, repeat quant in 1 week and continue precautions

## 2020-08-02 ENCOUNTER — Other Ambulatory Visit: Payer: Self-pay

## 2020-09-23 ENCOUNTER — Other Ambulatory Visit: Payer: Self-pay

## 2020-09-23 DIAGNOSIS — D500 Iron deficiency anemia secondary to blood loss (chronic): Secondary | ICD-10-CM

## 2020-09-26 ENCOUNTER — Inpatient Hospital Stay: Payer: Self-pay | Attending: Hematology and Oncology

## 2020-09-28 ENCOUNTER — Inpatient Hospital Stay: Payer: Self-pay | Admitting: Hematology and Oncology

## 2020-09-28 NOTE — Assessment & Plan Note (Deleted)
Microcytic anemia: Most likely diagnosis isiron deficiency anemia due tochronic heavy menstrual bleeding due to uterine fibroids. 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

## 2021-04-14 ENCOUNTER — Encounter: Payer: Self-pay | Admitting: Hematology and Oncology

## 2021-04-28 ENCOUNTER — Encounter: Payer: Self-pay | Admitting: Hematology and Oncology

## 2021-06-29 NOTE — Progress Notes (Signed)
..  Patient Assist/Replace for the following has been terminated. Medication: Feraheme (ferumoxytol) Reason for Termination: Program expired on 06/28/2021, no future dates of services scheduled.  Last DOS: 06/29/2020. Marland KitchenJuan Quam, CPhT IV Drug Replacement Specialist [place] Phone: [phone]

## 2021-08-03 IMAGING — US US OB < 14 WKS - US OB TV - US DOPPLER
1 series · 13 of 28 positions shown · non-contrast
Comparison: Ultrasound pelvic 02/09/2019

CLINICAL DATA: Last menstrual period 5 weeks ago. Beta HCG 43
quantitative per ED [REDACTED]. (positive urine test are home).
Cramping/vaginal bleeding symptoms. Known multiple fibroids.



[Series 1: us ob < 14 wks - us ob tv - us doppler · 13 of 368 slices shown]
[im 14/368]
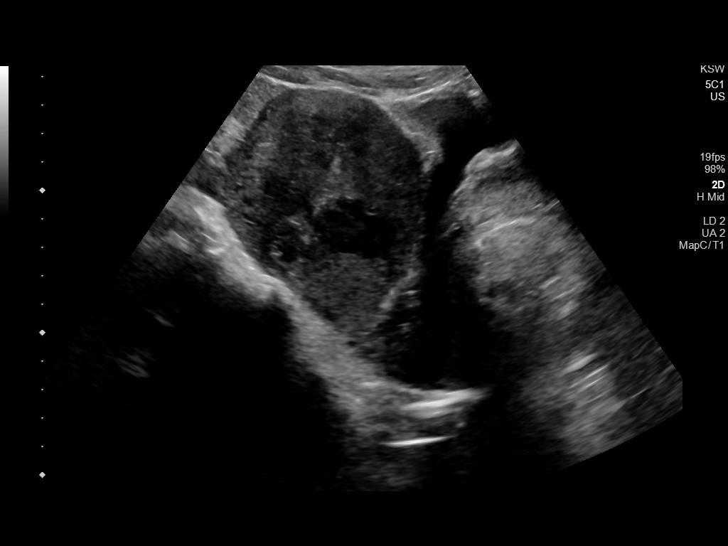
[im 41/368]
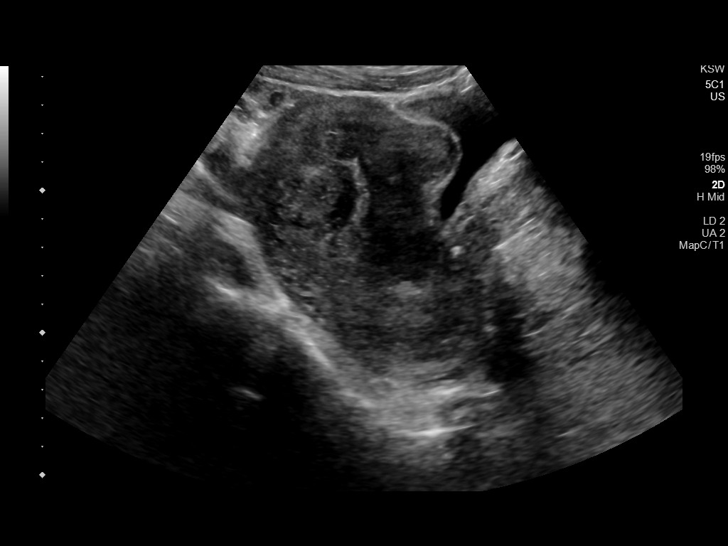
[im 68/368]
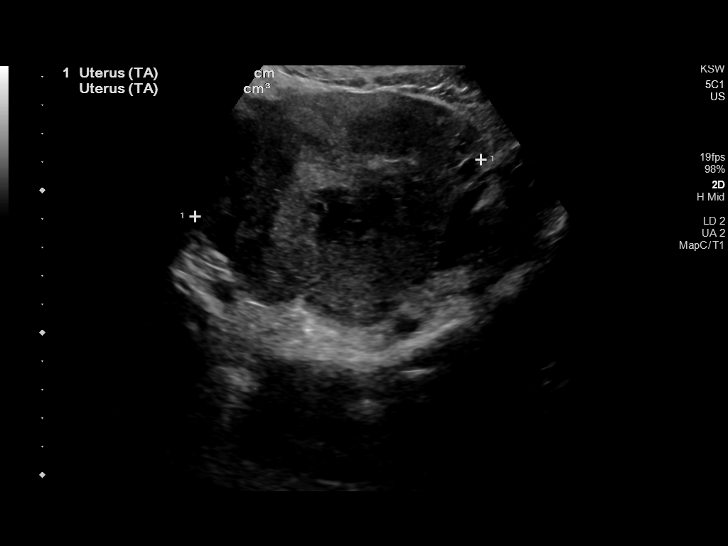
[im 96/368]
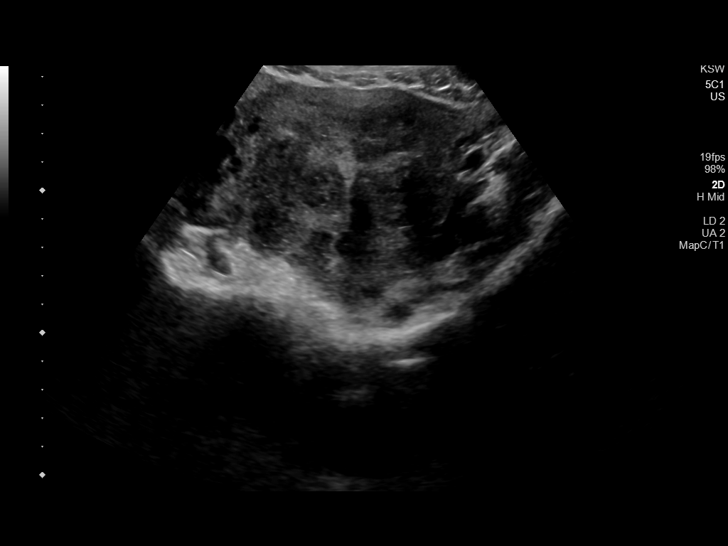
[im 123/368]
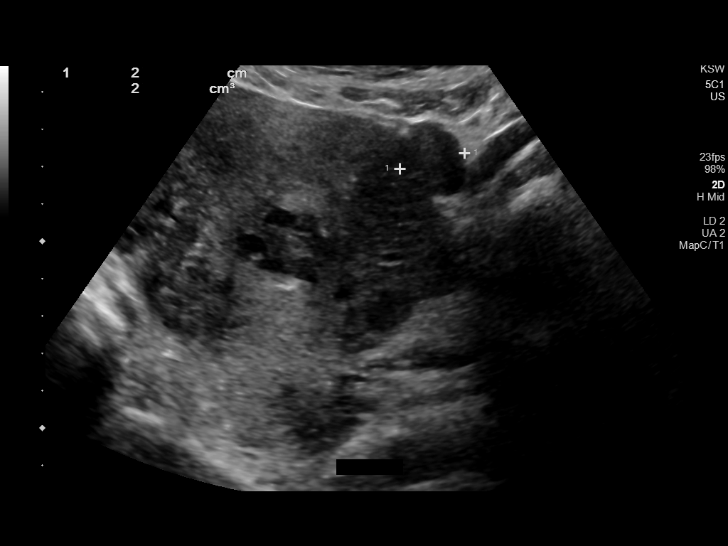
[im 150/368]
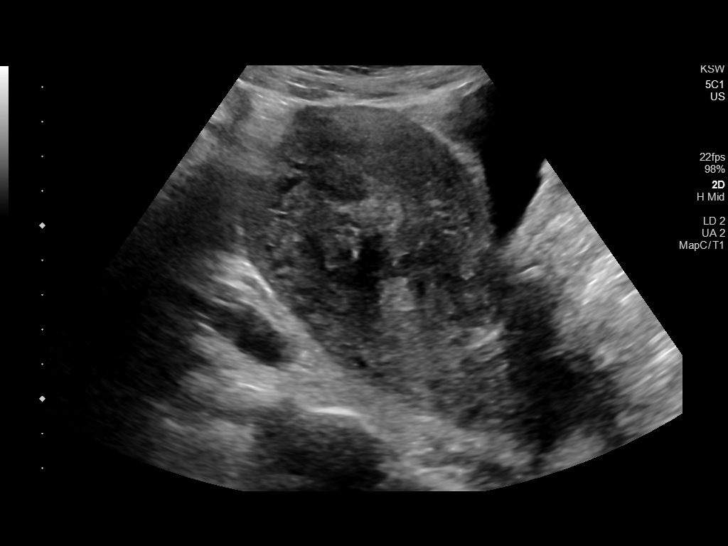
[im 191/368]
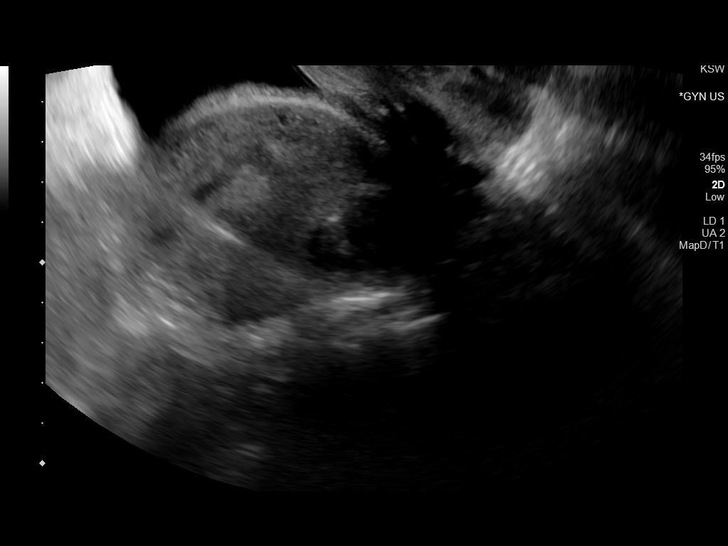
[im 218/368]
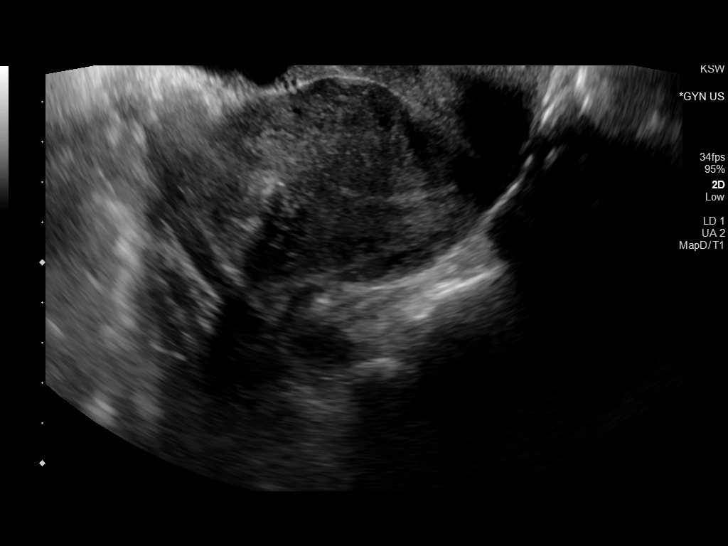
[im 245/368]
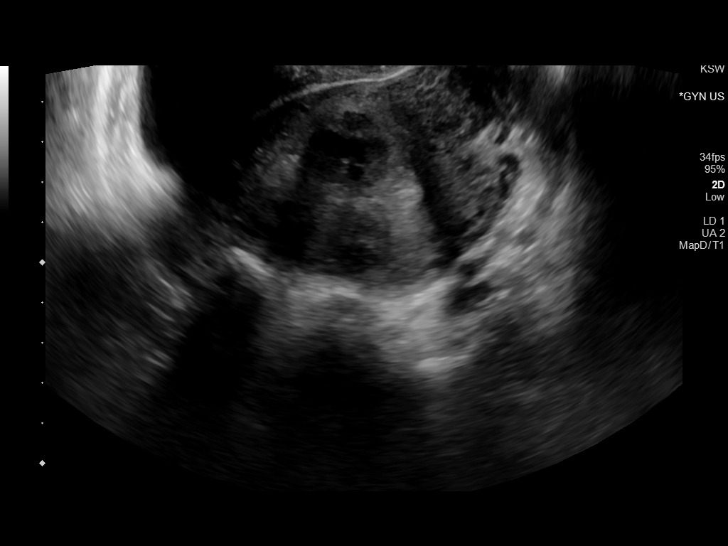
[im 272/368]
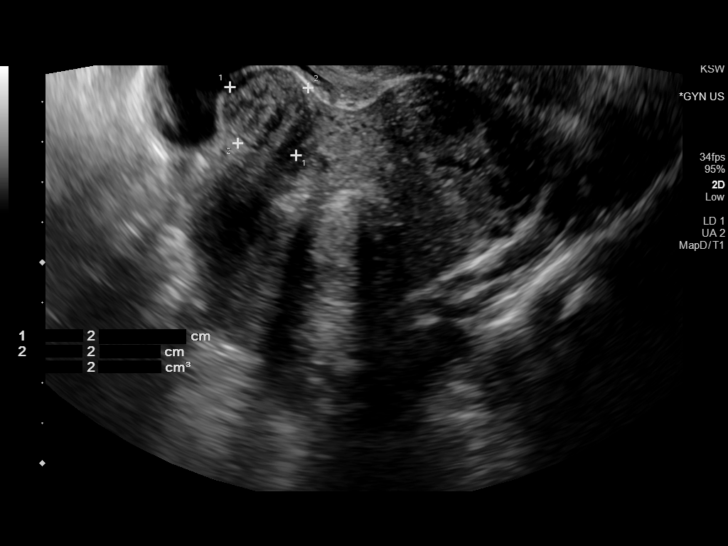
[im 300/368]
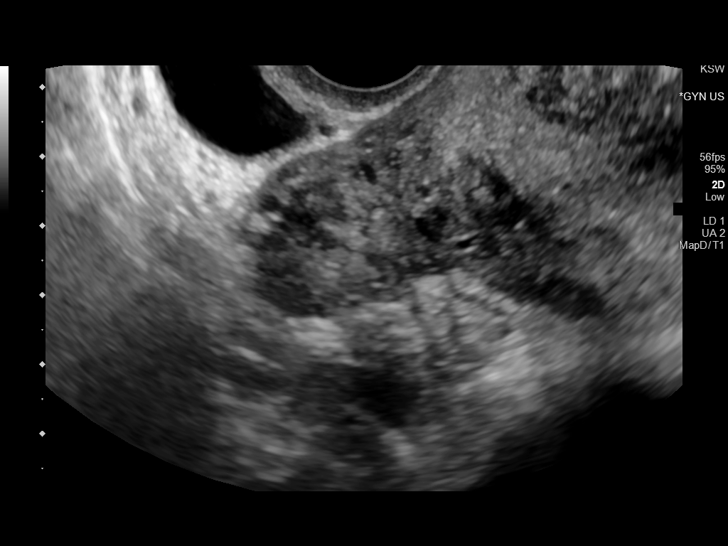
[im 327/368]
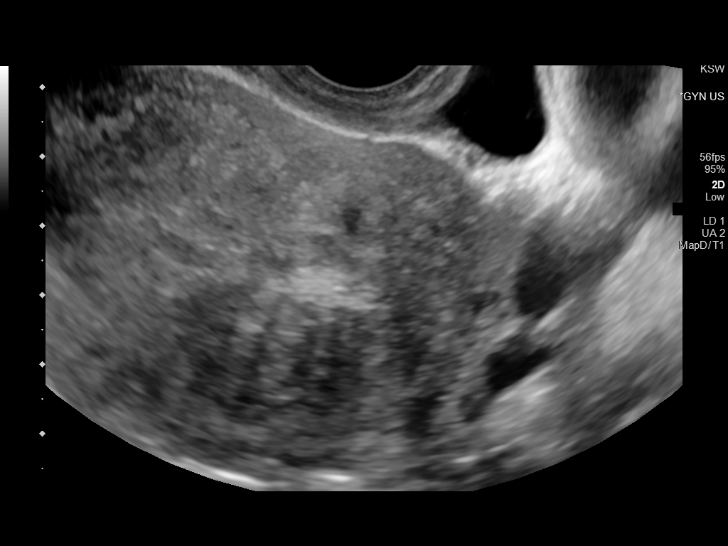
[im 354/368]
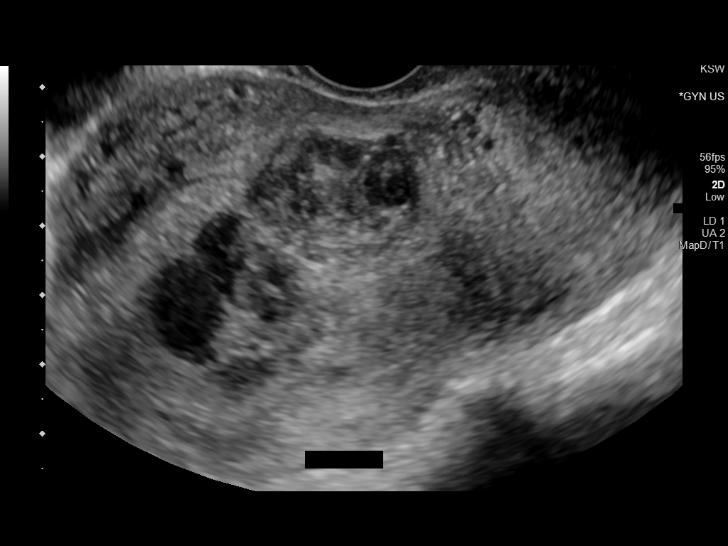

[13 of 28 positions shown; findings below may reference images not displayed]

FINDINGS: Uterus

No gestational sac identified. Measurements: 11 x 6.8 x 10.2 cm =
volume: 403 mL. Total of 3 heterogeneous uterine lesions identified
demonstrating Venetian blind appearance consistent with there
fibroids. There is 1 anterior left intramural fibroid measuring
x 2.2 x 2.5 cm (numbered fibroid #2). There is a submucosal fibroid
measuring 2.7 x 2.6 x 2.1 cm (numbered fibroid #1). There is another
submucosal fibroid subjacent to the first one measuring 2.7 x 2.6 x
2.1 cm (numbered fibroid #3).

Endometrium

Thickness: 28mm. Thickened and distorted by 2 submucosal fibroids as
described above.

Right ovary

Measurements: 1.8 x 1.4 x 2.1cm = volume: 2.9 mL. Normal
appearance/no adnexal mass.

Left ovary

Measurements: 2.4 x 1.7 x 2.8 = volume: 5.9 mL. Normal appearance/no
adnexal mass.

Other findings

No abnormal free fluid.
IMPRESSION: 1. No gestational sac visualized. No intrauterine or ectopic
pregnancy identified. Findings could represent a very early
pregnancy versus a failed pregnancy given Beta-HCG of 43 and
reported cramping/vaginal bleeding symptoms. Recommend trending of
beta HCG and if clinically indicated a follow-up pelvic ultrasound.
If desired pregnancy, recommend a non-emergent obstetric
consultation for discussion of a geriatric pregnancy.
2. Total of three uterine fibroids including two submucosal fibroids
with a thickened and distorted endometrium measuring up to 28 mm.
Recommend a non-emergent gynecologic consultation.

These results were called by telephone at the time of interpretation
on 07/21/2020 at [DATE] to provider KARGER CHATO , who verbally
acknowledged these results.

## 2022-12-28 ENCOUNTER — Encounter: Payer: Self-pay | Admitting: Hematology and Oncology

## 2023-10-15 ENCOUNTER — Other Ambulatory Visit: Payer: Self-pay | Admitting: Family Medicine

## 2023-10-15 DIAGNOSIS — Z1231 Encounter for screening mammogram for malignant neoplasm of breast: Secondary | ICD-10-CM

## 2024-05-28 ENCOUNTER — Other Ambulatory Visit (HOSPITAL_COMMUNITY): Payer: Self-pay | Admitting: Obstetrics

## 2024-05-28 DIAGNOSIS — D219 Benign neoplasm of connective and other soft tissue, unspecified: Secondary | ICD-10-CM

## 2024-06-03 ENCOUNTER — Encounter: Payer: Self-pay | Admitting: Hematology and Oncology

## 2024-06-10 ENCOUNTER — Ambulatory Visit (HOSPITAL_COMMUNITY)
Admission: RE | Admit: 2024-06-10 | Discharge: 2024-06-10 | Disposition: A | Discharge status: Home / Self Care | Payer: PRIVATE HEALTH INSURANCE | Source: Ambulatory Visit | Attending: Obstetrics | Admitting: Obstetrics

## 2024-06-10 DIAGNOSIS — D219 Benign neoplasm of connective and other soft tissue, unspecified: Secondary | ICD-10-CM | POA: Insufficient documentation

## 2024-06-10 MED ORDER — GADOBUTROL 1 MMOL/ML IV SOLN
6.0000 mL | Freq: Once | INTRAVENOUS | Status: AC | PRN
  Administered 2024-06-10: 6 mL via INTRAVENOUS

## 2024-08-07 ENCOUNTER — Encounter: Payer: PRIVATE HEALTH INSURANCE | Admitting: Obstetrics and Gynecology

## 2024-08-14 ENCOUNTER — Encounter: Payer: Self-pay | Admitting: Obstetrics and Gynecology

## 2024-08-14 ENCOUNTER — Ambulatory Visit (INDEPENDENT_AMBULATORY_CARE_PROVIDER_SITE_OTHER): Payer: PRIVATE HEALTH INSURANCE | Admitting: Obstetrics and Gynecology

## 2024-08-14 ENCOUNTER — Other Ambulatory Visit: Payer: Self-pay

## 2024-08-14 VITALS — BP 116/86 | HR 96 | Wt 151.0 lb

## 2024-08-14 DIAGNOSIS — D219 Benign neoplasm of connective and other soft tissue, unspecified: Secondary | ICD-10-CM | POA: Diagnosis not present

## 2024-08-14 DIAGNOSIS — N939 Abnormal uterine and vaginal bleeding, unspecified: Secondary | ICD-10-CM

## 2024-08-14 MED ORDER — TRANEXAMIC ACID 650 MG PO TABS: 650.0000 mg | ORAL_TABLET | Freq: Three times a day (TID) | ORAL | 4 refills | Status: AC

## 2024-08-14 NOTE — Progress Notes (Signed)
 NEW GYNECOLOGY PATIENT Patient name: Erin Powell MRN [id number]  Date of birth: January 18, 1981 Chief Complaint:   Fibroids and Vaginal Bleeding     History:  Priseis Cratty here for discussion of surgical management of fibroid. Had initially be evaluated and scheduled surgery with [facility]. She has been experiencing pain with and without menses for about 2 years. 2015 pap normal. She has not been sexually active for about 6 months (reports does not want to). Menses are 7 days and is soaking tampons. Prescribed TXA and has been taking it up to 4x a day as 3x a day was no longer working. When discussing surgery, informed recovery would be 4-6 weeks and have some difficulty with sitting up. Has had prior pregnancies including more recent pregnancy with current partner that ended in miscarriage. Living delivery was a vaginal delivery. No prior surgeries.       Gynecologic History Patient's last menstrual period was 08/09/2024. Contraception: abstinence   OB History  Gravida Para Term Preterm AB Living  3 1 1  2 1   SAB IAB Ectopic Multiple Live Births  1 1   1     # Outcome Date GA Lbr Len/2nd Weight Sex Type Anes PTL Lv  3 Term 2004     Vag-Spont   LIV  2 IAB           1 SAB              The following portions of the patient's history were reviewed and updated as appropriate: allergies, current medications, past family history, past medical history, past social history, past surgical history and problem list. Health Maintenance  Topic Date Due   HIV Screening  Never done   Hepatitis C Screening  Never done   DTaP/Tdap/Td vaccine (1 - Tdap) Never done   Hepatitis B Vaccine (1 of 3 - 19+ 3-dose series) Never done   HPV Vaccine (1 - 3-dose SCDM series) Never done   Breast Cancer Screening  Never done   Pap with HPV screening  05/09/2021   Flu Shot  05/08/2024   COVID-19 Vaccine (1 - 2025-26 season) Never done   Colon Cancer Screening  06/25/2029   Pneumococcal Vaccine  Aged Out    Meningitis B Vaccine  Aged Out     Review of Systems Pertinent items noted in HPI and remainder of comprehensive ROS otherwise negative.  Physical Exam:  BP 116/86   Pulse 96   Wt 151 lb (68.5 kg)   LMP 08/09/2024   BMI 25.92 kg/m  Physical Exam Vitals and nursing note reviewed.  Constitutional:      Appearance: Normal appearance.  Cardiovascular:     Rate and Rhythm: Normal rate.  Pulmonary:     Effort: Pulmonary effort is normal.     Breath sounds: Normal breath sounds.  Neurological:     General: No focal deficit present.     Mental Status: She is alert and oriented to person, place, and time.  Psychiatric:        Mood and Affect: Mood normal.        Behavior: Behavior normal.        Thought Content: Thought content normal.        Judgment: Judgment normal.       Assessment and Plan:  1. Abnormal uterine bleeding (AUB) (Primary) 2. Fibroid - Uterine fibroids: The patient's fibroids are symptomatic and treatment options of expectant management, medical therapy, and surgical therapy were discussed. - Expectant management -  The patient's fibroids were discussed and expectant management was offered with strict precautions. - TXA - this medication was discussed as a means to control vaginal bleeding. Discussed it would not impact growth of the fibroids either way. Its main advantage is avoiding hormonal or surgical therapy and gives an option for therapy besides expectant management.  - We discussed progesterone only options including - POP, Depo Provera and Lng-IUD.  We reviewed risks and benefits and proper use. Progesterone Only Birth Control Pills (POP)- The use of progesterone only birth control pills was discussed with the patient. The control of fibroid symptoms was discussed and the risks/benefits of therapy were discussed. The fact that this type of therapy does not contain estrogen was discussed with the patient.  Proper use was also discussed. We reviewed possible  induction of amenorrhea with each options as well as the possibility of break through bleeding.  - We discussed surgical/procedural options available: RFA (I.e. Sonata), UAE, myomectomy and hysterectomy. We discussed the risks and benefits for each of these specific procedures. For UAE, recommended preop MRI and referral to interventional radiology.  For the sonata, reviewed that we would need to sign a special consent form for this procedure. We discussed the types and sizes of fibroids that are candidates for hysteroscopic resection of fibroids as well.  - Following counseling, the patient would like to open myomectomy  - tranexamic acid (LYSTEDA) 650 MG TABS tablet; Take 1 tablet (650 mg total) by mouth 3 (three) times daily.  Dispense: 90 tablet; Refill: 4 - Ambulatory Referral For Surgery Scheduling  Patient desires surgical management with open myomectomy.  The risks of surgery were discussed in detail with the patient including but not limited to: bleeding which may require transfusion or reoperation; infection which may require prolonged hospitalization or re-hospitalization and antibiotic therapy; injury to bowel, bladder, ureters and major vessels or other surrounding organs which may lead to other procedures; formation of adhesions; need for additional procedures including laparotomy or subsequent procedures secondary to intraoperative injury or abnormal pathology; thromboembolic phenomenon; incisional problems and other postoperative or anesthesia complications.  Patient was told that the likelihood that her condition and symptoms will be treated effectively with this surgical management was high; the postoperative expectations were also discussed in detail. The patient also understands the alternative treatment options which were discussed in full. All questions were answered.    Follow-up: No follow-ups on file.      Carter Quarry, MD Obstetrician & Gynecologist, Faculty  Practice Minimally Invasive Gynecologic Surgery Center for [employer], [facility]

## 2024-08-27 ENCOUNTER — Encounter: Payer: Self-pay | Admitting: Obstetrics and Gynecology

## 2024-09-28 ENCOUNTER — Encounter: Payer: Self-pay | Admitting: Obstetrics and Gynecology

## 2024-10-20 ENCOUNTER — Other Ambulatory Visit: Payer: Self-pay | Admitting: Obstetrics and Gynecology

## 2024-10-20 ENCOUNTER — Encounter: Payer: Self-pay | Admitting: Obstetrics and Gynecology

## 2024-10-20 DIAGNOSIS — D219 Benign neoplasm of connective and other soft tissue, unspecified: Secondary | ICD-10-CM

## 2024-10-20 DIAGNOSIS — N939 Abnormal uterine and vaginal bleeding, unspecified: Secondary | ICD-10-CM

## 2024-10-20 DIAGNOSIS — D500 Iron deficiency anemia secondary to blood loss (chronic): Secondary | ICD-10-CM

## 2024-10-20 MED ORDER — FERROUS SULFATE 324 (65 FE) MG PO TBEC: 1.0000 | DELAYED_RELEASE_TABLET | ORAL | 1 refills | Status: AC

## 2024-10-27 ENCOUNTER — Other Ambulatory Visit: Payer: Self-pay

## 2024-10-27 ENCOUNTER — Other Ambulatory Visit: Payer: PRIVATE HEALTH INSURANCE

## 2024-10-27 DIAGNOSIS — N939 Abnormal uterine and vaginal bleeding, unspecified: Secondary | ICD-10-CM

## 2024-10-27 DIAGNOSIS — D219 Benign neoplasm of connective and other soft tissue, unspecified: Secondary | ICD-10-CM

## 2024-10-28 ENCOUNTER — Ambulatory Visit: Payer: Self-pay | Admitting: Obstetrics and Gynecology

## 2024-10-28 LAB — CBC
Hematocrit: 24.9 % — ABNORMAL LOW (ref 34.0–46.6)
Hemoglobin: 7.5 g/dL — ABNORMAL LOW (ref 11.1–15.9)
MCH: 23.5 pg — ABNORMAL LOW (ref 26.6–33.0)
MCHC: 30.1 g/dL — ABNORMAL LOW (ref 31.5–35.7)
MCV: 78 fL — ABNORMAL LOW (ref 79–97)
Platelets: 581 x10E3/uL — ABNORMAL HIGH (ref 150–450)
RBC: 3.19 x10E6/uL — ABNORMAL LOW (ref 3.77–5.28)
RDW: 15.8 % — ABNORMAL HIGH (ref 11.7–15.4)
WBC: 3.3 x10E3/uL — ABNORMAL LOW (ref 3.4–10.8)

## 2024-10-28 LAB — IRON,TIBC AND FERRITIN PANEL
Ferritin: 7 ng/mL — ABNORMAL LOW (ref 15–150)
Iron Saturation: 3 % — CL (ref 15–55)
Iron: 13 ug/dL — ABNORMAL LOW (ref 27–159)
Total Iron Binding Capacity: 489 ug/dL — ABNORMAL HIGH (ref 250–450)
UIBC: 476 ug/dL — ABNORMAL HIGH (ref 131–425)

## 2024-10-28 LAB — HEMOGLOBIN A1C
Est. average glucose Bld gHb Est-mCnc: 108 mg/dL
Hgb A1c MFr Bld: 5.4 % (ref 4.8–5.6)

## 2024-10-29 ENCOUNTER — Other Ambulatory Visit (HOSPITAL_COMMUNITY): Payer: Self-pay | Admitting: Obstetrics and Gynecology

## 2024-10-30 ENCOUNTER — Telehealth (HOSPITAL_COMMUNITY): Payer: Self-pay | Admitting: Pharmacy Technician

## 2024-10-30 NOTE — Telephone Encounter (Signed)
 Auth Submission: APPROVED Site of care: [facility] Payer: [employer] Medication & CPT/J Code(s) submitted: Venofer  (Iron  Sucrose) J1756 Diagnosis Code: D50.0 Route of submission (phone, fax, portal): [email or web address] Phone # Fax # Auth type: Buy/Bill HB Units/visits requested: 200MG  X 5 DOSES Reference number: [id number] Approval from: 10/30/2024 to 10/30/25   Submitted auth as urgent since she has upcoming surgery on 2/3 and per MD's message: I highly recommend getting iron  transfusion soon to help correct the anemia, especially since you are having surgery soon. Given how low your blood count is, I suspect you may need a blood transfusion given how much blood may be loss with surgery.    Approval letter scanned into the media tab.  Merle Cirelli, CPhT [facility] Phone: [phone] 10/30/2024

## 2024-11-02 ENCOUNTER — Telehealth (HOSPITAL_COMMUNITY): Payer: Self-pay | Admitting: Obstetrics and Gynecology

## 2024-11-02 NOTE — Telephone Encounter (Signed)
 Auth Submission: APPROVED Site of care: [facility] Payer: [employer] Medication & CPT/J Code(s) submitted: Monoferric (Ferric derisomaltose) [id number] Diagnosis Code: D50.0 Route of submission (phone, fax, portal): [email or web address] Phone # Fax # Auth type: Buy/Bill HB Units/visits requested: 1000MG  X 1 DOSE Reference number: [id number] Approval from: 11/03/24 to 11/03/25    Dagoberto Armour, CPhT [facility] Phone: [phone] 11/02/2024

## 2024-11-02 NOTE — Telephone Encounter (Signed)
 Patient referred to infusion pharmacy team for ambulatory infusion of IV iron .  Insurance - [employer] Executive of care - Site of care: [facility] Dx code - D50.0 IV Iron  Therapy - Monoferric 1000 mg x 1 dose. Pt scheduled for surgery on 2/3, received request to have iron  infusion complete prior to surgery. Infusion appointments - Scheduling team will schedule patient as soon as possible.    Thank you,  Norton Blush, PharmD, [facility] Pharmacist [facility]

## 2024-11-06 ENCOUNTER — Ambulatory Visit (HOSPITAL_COMMUNITY)
Admission: RE | Admit: 2024-11-06 | Discharge: 2024-11-06 | Disposition: A | Payer: PRIVATE HEALTH INSURANCE | Source: Ambulatory Visit | Attending: Obstetrics and Gynecology

## 2024-11-06 VITALS — BP 105/68 | HR 81 | Temp 98.8°F | Resp 16

## 2024-11-06 DIAGNOSIS — D500 Iron deficiency anemia secondary to blood loss (chronic): Secondary | ICD-10-CM | POA: Insufficient documentation

## 2024-11-06 MED ORDER — SODIUM CHLORIDE 0.9 % IV SOLN
1000.0000 mg | Freq: Once | INTRAVENOUS | Status: AC
  Administered 2024-11-06: 1000 mg via INTRAVENOUS

## 2024-11-06 MED ORDER — SODIUM CHLORIDE 0.9 % IV SOLN
INTRAVENOUS | Status: AC
  Filled 2024-11-06: qty 10

## 2024-11-06 NOTE — Progress Notes (Signed)
 Spoke w/ via phone for pre-op interview---Erin Powell needs dos----     CBC & UPT    Powell results------ COVID test -----patient states asymptomatic no test needed Arrive at -------0530 NPO after MN NO Solid Food.  Clear liquids from MN until--- Pre-Surgery Ensure or G2:  Med rec completed Medications to take morning of surgery -----N/A Diabetic medication -----  GLP1 agonist last dose:N/A GLP1 instructions:  Patient instructed no nail polish to be worn day of surgery Patient instructed to bring photo id and insurance card day of surgery Patient aware to have Driver (ride ) / caregiver    for 24 hours after surgery - Mom  Patient Special Instructions -----gave number to call if unable to come on Tuesday due to weather Pre-Op special Instructions -----  Patient verbalized understanding of instructions that were given at this phone interview. Patient denies chest pain, sob, fever, cough at the interview.

## 2024-11-09 ENCOUNTER — Encounter (HOSPITAL_COMMUNITY): Payer: Self-pay | Admitting: Obstetrics and Gynecology

## 2024-11-09 ENCOUNTER — Telehealth: Payer: Self-pay

## 2024-11-09 NOTE — Telephone Encounter (Signed)
 Called patient to provide insurance information relating to her 11/10/24 surgery.I left a voicemail letting patient know that her insurance plan does not provide surgical benefits/coverage.

## 2024-11-10 ENCOUNTER — Ambulatory Visit (HOSPITAL_COMMUNITY)
Admission: RE | Admit: 2024-11-10 | Discharge: 2024-11-11 | Disposition: A | Payer: PRIVATE HEALTH INSURANCE | Attending: Obstetrics and Gynecology | Admitting: Obstetrics and Gynecology

## 2024-11-10 ENCOUNTER — Encounter (HOSPITAL_COMMUNITY): Payer: Self-pay | Admitting: Obstetrics and Gynecology

## 2024-11-10 ENCOUNTER — Encounter (HOSPITAL_COMMUNITY): Admission: RE | Disposition: A | Payer: Self-pay | Source: Home / Self Care | Attending: Obstetrics and Gynecology

## 2024-11-10 ENCOUNTER — Other Ambulatory Visit (HOSPITAL_COMMUNITY): Payer: Self-pay

## 2024-11-10 ENCOUNTER — Encounter (HOSPITAL_COMMUNITY): Payer: Self-pay | Admitting: Physician Assistant

## 2024-11-10 ENCOUNTER — Encounter: Payer: Self-pay | Admitting: Hematology and Oncology

## 2024-11-10 DIAGNOSIS — D252 Subserosal leiomyoma of uterus: Secondary | ICD-10-CM | POA: Insufficient documentation

## 2024-11-10 DIAGNOSIS — N939 Abnormal uterine and vaginal bleeding, unspecified: Secondary | ICD-10-CM | POA: Diagnosis present

## 2024-11-10 DIAGNOSIS — Z01818 Encounter for other preprocedural examination: Secondary | ICD-10-CM

## 2024-11-10 DIAGNOSIS — D250 Submucous leiomyoma of uterus: Secondary | ICD-10-CM | POA: Insufficient documentation

## 2024-11-10 DIAGNOSIS — D649 Anemia, unspecified: Secondary | ICD-10-CM

## 2024-11-10 DIAGNOSIS — D500 Iron deficiency anemia secondary to blood loss (chronic): Secondary | ICD-10-CM

## 2024-11-10 DIAGNOSIS — K769 Liver disease, unspecified: Secondary | ICD-10-CM | POA: Insufficient documentation

## 2024-11-10 DIAGNOSIS — D219 Benign neoplasm of connective and other soft tissue, unspecified: Secondary | ICD-10-CM | POA: Diagnosis present

## 2024-11-10 DIAGNOSIS — D62 Acute posthemorrhagic anemia: Secondary | ICD-10-CM | POA: Insufficient documentation

## 2024-11-10 LAB — CBC
HCT: 26.4 % — ABNORMAL LOW (ref 36.0–46.0)
HCT: 21.1 % — ABNORMAL LOW (ref 36.0–46.0)
Hemoglobin: 8.3 g/dL — ABNORMAL LOW (ref 12.0–15.0)
Hemoglobin: 6.6 g/dL — CL (ref 12.0–15.0)
MCH: 23.9 pg — ABNORMAL LOW (ref 26.0–34.0)
MCH: 23.7 pg — ABNORMAL LOW (ref 26.0–34.0)
MCHC: 31.4 g/dL (ref 30.0–36.0)
MCHC: 31.3 g/dL (ref 30.0–36.0)
MCV: 76.1 fL — ABNORMAL LOW (ref 80.0–100.0)
MCV: 75.6 fL — ABNORMAL LOW (ref 80.0–100.0)
Platelets: 405 10*3/uL — ABNORMAL HIGH (ref 150–400)
Platelets: 358 10*3/uL (ref 150–400)
RBC: 3.47 MIL/uL — ABNORMAL LOW (ref 3.87–5.11)
RBC: 2.79 MIL/uL — ABNORMAL LOW (ref 3.87–5.11)
RDW: 19 % — ABNORMAL HIGH (ref 11.5–15.5)
RDW: 18.8 % — ABNORMAL HIGH (ref 11.5–15.5)
WBC: 5.8 10*3/uL (ref 4.0–10.5)
WBC: 11.6 10*3/uL — ABNORMAL HIGH (ref 4.0–10.5)
nRBC: 0.3 % — ABNORMAL HIGH (ref 0.0–0.2)
nRBC: 0 % (ref 0.0–0.2)

## 2024-11-10 LAB — PREPARE RBC (CROSSMATCH)

## 2024-11-10 LAB — POCT PREGNANCY, URINE: Preg Test, Ur: NEGATIVE

## 2024-11-10 MED ORDER — OXYCODONE HCL 5 MG/5ML PO SOLN: 5.0000 mg | Freq: Once | ORAL | Status: DC | PRN

## 2024-11-10 MED ORDER — ONDANSETRON 4 MG PO TBDP
4.0000 mg | ORAL_TABLET | Freq: Four times a day (QID) | ORAL | 0 refills | Status: DC | PRN
  Filled 2024-11-10: qty 20, 5d supply, fill #0

## 2024-11-10 MED ORDER — PROPOFOL 500 MG/50ML IV EMUL
INTRAVENOUS | Status: DC | PRN
  Administered 2024-11-10: 150 ug/kg/min via INTRAVENOUS

## 2024-11-10 MED ORDER — POLYETHYLENE GLYCOL 3350 17 GM/SCOOP PO POWD: 17.0000 g | Freq: Every day | ORAL | 1 refills | Status: AC

## 2024-11-10 MED ORDER — ROCURONIUM BROMIDE 10 MG/ML (PF) SYRINGE
PREFILLED_SYRINGE | INTRAVENOUS | Status: DC | PRN
  Administered 2024-11-10: 20 mg via INTRAVENOUS
  Administered 2024-11-10: 60 mg via INTRAVENOUS
  Administered 2024-11-10: 20 mg via INTRAVENOUS
  Administered 2024-11-10: 30 mg via INTRAVENOUS
  Administered 2024-11-10: 40 mg via INTRAVENOUS

## 2024-11-10 MED ORDER — BUPIVACAINE LIPOSOME 1.3 % IJ SUSP
INTRAMUSCULAR | Status: DC | PRN
  Administered 2024-11-10: 40 mL

## 2024-11-10 MED ORDER — SUGAMMADEX SODIUM 200 MG/2ML IV SOLN
INTRAVENOUS | Status: DC | PRN
  Administered 2024-11-10: 257.6 mg via INTRAVENOUS

## 2024-11-10 MED ORDER — MEDROXYPROGESTERONE ACETATE 10 MG PO TABS: 10.0000 mg | ORAL_TABLET | Freq: Every day | ORAL | 0 refills | Status: AC

## 2024-11-10 MED ORDER — FENTANYL CITRATE (PF) 100 MCG/2ML IJ SOLN
25.0000 ug | INTRAMUSCULAR | Status: DC | PRN
  Administered 2024-11-10: 50 ug via INTRAVENOUS

## 2024-11-10 MED ORDER — ESTRADIOL 1 MG PO TABS
1.0000 mg | ORAL_TABLET | Freq: Every day | ORAL | 0 refills | Status: DC
  Filled 2024-11-10: qty 30, 30d supply, fill #0

## 2024-11-10 MED ORDER — ACETAMINOPHEN 500 MG PO TABS
ORAL_TABLET | ORAL | Status: AC
  Filled 2024-11-10: qty 2

## 2024-11-10 MED ORDER — BUPIVACAINE HCL (PF) 0.5 % IJ SOLN
INTRAMUSCULAR | Status: DC | PRN
  Administered 2024-11-10: 10 mL

## 2024-11-10 MED ORDER — PROPOFOL 10 MG/ML IV BOLUS
INTRAVENOUS | Status: AC
  Filled 2024-11-10: qty 20

## 2024-11-10 MED ORDER — OXYCODONE HCL 5 MG PO TABS
5.0000 mg | ORAL_TABLET | ORAL | Status: DC | PRN
  Administered 2024-11-10 – 2024-11-11 (×3): 10 mg via ORAL
  Filled 2024-11-10 (×3): qty 2

## 2024-11-10 MED ORDER — SODIUM CHLORIDE 0.9% IV SOLUTION: Freq: Once | INTRAVENOUS | Status: AC

## 2024-11-10 MED ORDER — FENTANYL CITRATE (PF) 100 MCG/2ML IJ SOLN
INTRAMUSCULAR | Status: AC
  Filled 2024-11-10: qty 2

## 2024-11-10 MED ORDER — ACETAMINOPHEN 10 MG/ML IV SOLN
1000.0000 mg | Freq: Once | INTRAVENOUS | Status: DC | PRN
  Administered 2024-11-10: 1000 mg via INTRAVENOUS

## 2024-11-10 MED ORDER — ONDANSETRON HCL 4 MG/2ML IJ SOLN
INTRAMUSCULAR | Status: AC
  Filled 2024-11-10: qty 2

## 2024-11-10 MED ORDER — HYDROMORPHONE HCL 1 MG/ML IJ SOLN
0.2000 mg | INTRAMUSCULAR | Status: DC | PRN
  Administered 2024-11-10: 0.4 mg via INTRAVENOUS
  Filled 2024-11-10 (×2): qty 0.5

## 2024-11-10 MED ORDER — ALBUMIN HUMAN 5 % IV SOLN: INTRAVENOUS | Status: DC | PRN

## 2024-11-10 MED ORDER — HYDROMORPHONE HCL 1 MG/ML IJ SOLN
INTRAMUSCULAR | Status: AC
  Filled 2024-11-10: qty 0.5

## 2024-11-10 MED ORDER — PROPOFOL 10 MG/ML IV BOLUS
INTRAVENOUS | Status: DC | PRN
  Administered 2024-11-10: 30 mg via INTRAVENOUS
  Administered 2024-11-10: 120 mg via INTRAVENOUS

## 2024-11-10 MED ORDER — ACETAMINOPHEN 500 MG PO TABS
1000.0000 mg | ORAL_TABLET | Freq: Four times a day (QID) | ORAL | 1 refills | Status: DC | PRN
  Filled 2024-11-10: qty 120, 15d supply, fill #0

## 2024-11-10 MED ORDER — KETOROLAC TROMETHAMINE 30 MG/ML IJ SOLN
30.0000 mg | Freq: Four times a day (QID) | INTRAMUSCULAR | Status: DC
  Administered 2024-11-10 – 2024-11-11 (×3): 30 mg via INTRAVENOUS
  Filled 2024-11-10 (×3): qty 1

## 2024-11-10 MED ORDER — OXIDIZED CELLULOSE EX PADS
MEDICATED_PAD | CUTANEOUS | Status: DC | PRN
  Administered 2024-11-10: 1 via TOPICAL

## 2024-11-10 MED ORDER — OXYCODONE HCL 5 MG PO TABS
5.0000 mg | ORAL_TABLET | ORAL | 0 refills | Status: DC | PRN
  Filled 2024-11-10: qty 30, 5d supply, fill #0

## 2024-11-10 MED ORDER — SODIUM CHLORIDE (PF) 0.9 % IJ SOLN
INTRAMUSCULAR | Status: AC
  Filled 2024-11-10: qty 100

## 2024-11-10 MED ORDER — VASOPRESSIN 20 UNIT/ML IV SOLN
INTRAVENOUS | Status: AC
  Filled 2024-11-10: qty 1

## 2024-11-10 MED ORDER — LACTATED RINGERS IV SOLN: INTRAVENOUS | Status: DC

## 2024-11-10 MED ORDER — IBUPROFEN 200 MG PO TABS: 400.0000 mg | ORAL_TABLET | Freq: Four times a day (QID) | ORAL | 1 refills | Status: AC | PRN

## 2024-11-10 MED ORDER — ROCURONIUM BROMIDE 10 MG/ML (PF) SYRINGE
PREFILLED_SYRINGE | INTRAVENOUS | Status: AC
  Filled 2024-11-10: qty 10

## 2024-11-10 MED ORDER — OXYCODONE HCL 5 MG PO TABS: 5.0000 mg | ORAL_TABLET | ORAL | 0 refills | Status: AC | PRN

## 2024-11-10 MED ORDER — CHLORHEXIDINE GLUCONATE 0.12 % MT SOLN
15.0000 mL | Freq: Once | OROMUCOSAL | Status: AC
  Administered 2024-11-10: 15 mL via OROMUCOSAL

## 2024-11-10 MED ORDER — LIDOCAINE 2% (20 MG/ML) 5 ML SYRINGE
INTRAMUSCULAR | Status: AC
  Filled 2024-11-10: qty 5

## 2024-11-10 MED ORDER — ORAL CARE MOUTH RINSE: 15.0000 mL | Freq: Once | OROMUCOSAL | Status: AC

## 2024-11-10 MED ORDER — SIMETHICONE 80 MG PO CHEW: 80.0000 mg | CHEWABLE_TABLET | Freq: Four times a day (QID) | ORAL | Status: DC | PRN

## 2024-11-10 MED ORDER — TRANEXAMIC ACID-NACL 1000-0.7 MG/100ML-% IV SOLN
1000.0000 mg | Freq: Once | INTRAVENOUS | Status: AC
  Administered 2024-11-10: 1000 mg via INTRAVENOUS

## 2024-11-10 MED ORDER — ACETAMINOPHEN 10 MG/ML IV SOLN
INTRAVENOUS | Status: AC
  Filled 2024-11-10: qty 100

## 2024-11-10 MED ORDER — ONDANSETRON HCL 4 MG/2ML IJ SOLN: 4.0000 mg | Freq: Four times a day (QID) | INTRAMUSCULAR | Status: DC | PRN

## 2024-11-10 MED ORDER — HYDROMORPHONE HCL 1 MG/ML IJ SOLN
INTRAMUSCULAR | Status: DC | PRN
  Administered 2024-11-10 (×2): .5 mg via INTRAVENOUS

## 2024-11-10 MED ORDER — BUPIVACAINE HCL (PF) 0.25 % IJ SOLN
INTRAMUSCULAR | Status: AC
  Filled 2024-11-10: qty 60

## 2024-11-10 MED ORDER — VASOPRESSIN 20 UNIT/ML IV SOLN
INTRAVENOUS | Status: DC | PRN
  Administered 2024-11-10: 23 mL
  Administered 2024-11-10: 1 mL

## 2024-11-10 MED ORDER — IBUPROFEN 200 MG PO TABS
400.0000 mg | ORAL_TABLET | Freq: Four times a day (QID) | ORAL | 1 refills | Status: DC | PRN
  Filled 2024-11-10: qty 120, 15d supply, fill #0

## 2024-11-10 MED ORDER — DEXMEDETOMIDINE HCL IN NACL 80 MCG/20ML IV SOLN
INTRAVENOUS | Status: DC | PRN
  Administered 2024-11-10: 8 ug via INTRAVENOUS

## 2024-11-10 MED ORDER — HEMOSTATIC AGENTS (NO CHARGE) OPTIME
TOPICAL | Status: DC | PRN
  Administered 2024-11-10: 1 via TOPICAL

## 2024-11-10 MED ORDER — POVIDONE-IODINE 10 % EX SWAB: 2.0000 | Freq: Once | CUTANEOUS | Status: DC

## 2024-11-10 MED ORDER — VASOPRESSIN 20 UNIT/ML IV SOLN: INTRAVENOUS | Status: DC | PRN

## 2024-11-10 MED ORDER — BUPIVACAINE LIPOSOME 1.3 % IJ SUSP
INTRAMUSCULAR | Status: AC
  Filled 2024-11-10: qty 40

## 2024-11-10 MED ORDER — FLUORESCEIN SODIUM 10 % IV SOLN
INTRAVENOUS | Status: AC
  Filled 2024-11-10: qty 10

## 2024-11-10 MED ORDER — IBUPROFEN 600 MG PO TABS: 600.0000 mg | ORAL_TABLET | Freq: Four times a day (QID) | ORAL | Status: DC

## 2024-11-10 MED ORDER — POLYETHYLENE GLYCOL 3350 17 G PO PACK: 17.0000 g | PACK | Freq: Every day | ORAL | Status: DC | PRN

## 2024-11-10 MED ORDER — CEFAZOLIN SODIUM-DEXTROSE 2-4 GM/100ML-% IV SOLN
2.0000 g | INTRAVENOUS | Status: AC
  Administered 2024-11-10: 2 g via INTRAVENOUS

## 2024-11-10 MED ORDER — SODIUM CHLORIDE 0.9 % IV SOLN: INTRAVENOUS | Status: DC | PRN

## 2024-11-10 MED ORDER — CHLORHEXIDINE GLUCONATE 0.12 % MT SOLN
OROMUCOSAL | Status: AC
  Filled 2024-11-10: qty 15

## 2024-11-10 MED ORDER — FENTANYL CITRATE (PF) 250 MCG/5ML IJ SOLN
INTRAMUSCULAR | Status: DC | PRN
  Administered 2024-11-10: 100 ug via INTRAVENOUS
  Administered 2024-11-10 (×4): 50 ug via INTRAVENOUS
  Administered 2024-11-10: 75 ug via INTRAVENOUS
  Administered 2024-11-10: 125 ug via INTRAVENOUS

## 2024-11-10 MED ORDER — MEDROXYPROGESTERONE ACETATE 10 MG PO TABS
10.0000 mg | ORAL_TABLET | Freq: Every day | ORAL | 0 refills | Status: DC
  Filled 2024-11-10: qty 10, 10d supply, fill #0

## 2024-11-10 MED ORDER — 0.9 % SODIUM CHLORIDE (POUR BTL) OPTIME
TOPICAL | Status: DC | PRN
  Administered 2024-11-10: 1000 mL

## 2024-11-10 MED ORDER — TRANEXAMIC ACID-NACL 1000-0.7 MG/100ML-% IV SOLN
INTRAVENOUS | Status: AC
  Filled 2024-11-10: qty 100

## 2024-11-10 MED ORDER — SODIUM CHLORIDE 0.9 % IR SOLN
Status: DC | PRN
  Administered 2024-11-10: 1000 mL

## 2024-11-10 MED ORDER — ACETAMINOPHEN 500 MG PO TABS: 1000.0000 mg | ORAL_TABLET | Freq: Four times a day (QID) | ORAL | 1 refills | Status: AC | PRN

## 2024-11-10 MED ORDER — MIDAZOLAM HCL (PF) 2 MG/2ML IJ SOLN
INTRAMUSCULAR | Status: DC | PRN
  Administered 2024-11-10: 2 mg via INTRAVENOUS

## 2024-11-10 MED ORDER — DEXAMETHASONE SOD PHOSPHATE PF 10 MG/ML IJ SOLN
INTRAMUSCULAR | Status: DC | PRN
  Administered 2024-11-10: 8 mg via INTRAVENOUS

## 2024-11-10 MED ORDER — ONDANSETRON 4 MG PO TBDP: 4.0000 mg | ORAL_TABLET | Freq: Four times a day (QID) | ORAL | 0 refills | Status: AC | PRN

## 2024-11-10 MED ORDER — DEXAMETHASONE SOD PHOSPHATE PF 10 MG/ML IJ SOLN
INTRAMUSCULAR | Status: AC
  Filled 2024-11-10: qty 1

## 2024-11-10 MED ORDER — ONDANSETRON HCL 4 MG PO TABS: 4.0000 mg | ORAL_TABLET | Freq: Four times a day (QID) | ORAL | Status: DC | PRN

## 2024-11-10 MED ORDER — MIDAZOLAM HCL 2 MG/2ML IJ SOLN
INTRAMUSCULAR | Status: AC
  Filled 2024-11-10: qty 2

## 2024-11-10 MED ORDER — ACETAMINOPHEN 500 MG PO TABS
1000.0000 mg | ORAL_TABLET | ORAL | Status: AC
  Administered 2024-11-10: 1000 mg via ORAL

## 2024-11-10 MED ORDER — BUPIVACAINE HCL (PF) 0.5 % IJ SOLN
INTRAMUSCULAR | Status: AC
  Filled 2024-11-10: qty 60

## 2024-11-10 MED ORDER — LIDOCAINE 2% (20 MG/ML) 5 ML SYRINGE
INTRAMUSCULAR | Status: DC | PRN
  Administered 2024-11-10: 60 mg via INTRAVENOUS

## 2024-11-10 MED ORDER — ACETAMINOPHEN 500 MG PO TABS
1000.0000 mg | ORAL_TABLET | Freq: Four times a day (QID) | ORAL | Status: DC
  Administered 2024-11-10 – 2024-11-11 (×3): 1000 mg via ORAL
  Filled 2024-11-10 (×4): qty 2

## 2024-11-10 MED ORDER — ESTRADIOL 1 MG PO TABS: 1.0000 mg | ORAL_TABLET | Freq: Every day | ORAL | 0 refills | Status: AC

## 2024-11-10 MED ORDER — FENTANYL CITRATE (PF) 250 MCG/5ML IJ SOLN
INTRAMUSCULAR | Status: AC
  Filled 2024-11-10: qty 5

## 2024-11-10 MED ORDER — POLYETHYLENE GLYCOL 3350 17 GM/SCOOP PO POWD
17.0000 g | Freq: Every day | ORAL | 1 refills | Status: DC
  Filled 2024-11-10: qty 476, 28d supply, fill #0

## 2024-11-10 MED ORDER — OXYCODONE HCL 5 MG PO TABS: 5.0000 mg | ORAL_TABLET | Freq: Once | ORAL | Status: DC | PRN

## 2024-11-10 MED ORDER — CEFAZOLIN SODIUM-DEXTROSE 2-4 GM/100ML-% IV SOLN
INTRAVENOUS | Status: AC
  Filled 2024-11-10: qty 100

## 2024-11-10 NOTE — Transfer of Care (Signed)
 Immediate Anesthesia Transfer of Care Note  Patient: Erin Powell  Procedure(s) Performed: MYOMECTOMY, ROBOT-ASSISTED MINI LAPARTOMY (Abdomen)  Patient Location: PACU  Anesthesia Type:General  Level of Consciousness: awake and drowsy  Airway & Oxygen Therapy: Patient Spontanous Breathing  Post-op Assessment: Report given to RN, Post -op Vital signs reviewed and stable, and Patient moving all extremities  Post vital signs: Reviewed and stable  Last Vitals:  Vitals Value Taken Time  BP 116/81 11/10/24 13:25  Temp    Pulse 90 11/10/24 13:29  Resp 19 11/10/24 13:29  SpO2 97 % 11/10/24 13:29  Vitals shown include unfiled device data.  Last Pain:  Vitals:   11/10/24 0627  TempSrc: Oral  PainSc: 0-No pain      Patients Stated Pain Goal: 3 (11/10/24 9372)  Complications: No notable events documented.

## 2024-11-10 NOTE — Discharge Instructions (Addendum)
 Post-surgical Instructions, Outpatient Surgery  You may expect to feel dizzy, weak, and drowsy for as long as 24 hours after receiving the medicine that made you sleep (anesthetic). For the first 24 hours after your surgery:   Do not drive a car, ride a bicycle, participate in physical activities, or take public transportation until you are done taking narcotic pain medicines or as directed by Dr. Jeralyn.  Do not drink alcohol or take tranquilizers.  Do not take medicine that has not been prescribed by your physicians.  Do not sign important papers or make important decisions while on narcotic pain medicines.  Have a responsible person with you.   CARE OF INCISION If you have a bandage, you may remove it in one day.  If there are steri-strips or dermabond, just let this loosen on its own.  You may shower on the first day after your surgery.  Do not sit in a tub bath for one week. Avoid heavy lifting (more than 10 pounds/4.5 kilograms), pushing, or pulling.  Avoid activities that may risk injury to your incisions.   PAIN MANAGEMENT Ibuprofen  800mg .  (This is the same as 4-200mg  over the counter tablets of Motrin  or ibuprofen .)  Take this every 6 hours or as needed for cramping.   Acetaminophen  1000mg  (This is the same as 2-500mg  over the counter extra strength tylenol ). Take this every 6 hours for the first 3 days or as needed afterwards for pain Oxycodone  5mg  For more severe pain, take one or two tablets every four to six hours as needed for pain control.  (Remember that narcotic pain medications increase your risk of constipation.  If this becomes a problem, you may take an over the counter laxative like miralax .)  DO'S AND DON'T'S Do not take a tub bath for 4 weeks.  You may shower on the first day after your surgery Do not do any heavy lifting for one to two weeks.  This increases the chance of bleeding. Do move around as you feel able.  Stairs are fine.  You may begin to exercise again as  you feel able.  Do not lift any weights for two weeks. Do not put anything in the vagina for two weeks--no tampons, intercourse, or douching.    REGULAR MEDIATIONS/VITAMINS: You may restart all of your regular medications as prescribed. You may restart all of your vitamins as you normally take them.    PLEASE CALL OR SEEK MEDICAL CARE IF: You have persistent nausea and vomiting.  You have trouble eating or drinking.  You have an oral temperature above 100.5.  You have constipation that is not helped by adjusting diet or increasing fluid intake. Pain medicines are a common cause of constipation.  You have heavy vaginal bleeding You have redness or drainage from your incision(s) or there is increasing pain or tenderness near or in the surgical site.

## 2024-11-10 NOTE — Progress Notes (Signed)
 Doing ok, having pain. Has tolerated crackers and ordering steam vegetables. Has not yet been up to stand and has not voided. Denis nausea  Today's Vitals   11/10/24 1554 11/10/24 1617 11/10/24 1700 11/10/24 1730  BP: 133/80  124/80 119/72  Pulse: 83  87 89  Resp: 15     Temp: 97.8 F (36.6 C)     TempSrc: Oral     SpO2: 99%  100% 98%  Weight:      Height:      PainSc: 7  7      Body mass index is 25.15 kg/m.  NAD Normal respiratory effort Abdomen tender throughout  A/P - postop CBC due to EBL and baseline anemia - due to void - encourage ambulation - regular diet - SCDs and IS  Erin Quarry, MD, FACOG Minimally Invasive Gynecologic Surgery  Obstetrics and Gynecology, [facility], [facility] 11/10/2024

## 2024-11-10 NOTE — Anesthesia Procedure Notes (Signed)
 Procedure Name: Intubation Date/Time: 11/10/2024 7:48 AM  Performed by: Jerl Donald LABOR, CRNAPre-anesthesia Checklist: Patient identified, Emergency Drugs available, Suction available and Patient being monitored Patient Re-evaluated:Patient Re-evaluated prior to induction Oxygen Delivery Method: Circle System Utilized Preoxygenation: Pre-oxygenation with 100% oxygen Induction Type: IV induction Ventilation: Mask ventilation without difficulty Laryngoscope Size: Mac and 3 Grade View: Grade I Tube type: Oral Tube size: 7.0 mm Number of attempts: 1 Airway Equipment and Method: Stylet Placement Confirmation: ETT inserted through vocal cords under direct vision, positive ETCO2 and breath sounds checked- equal and bilateral Secured at: 22 cm Tube secured with: Tape Dental Injury: Teeth and Oropharynx as per pre-operative assessment

## 2024-11-11 ENCOUNTER — Other Ambulatory Visit: Payer: Self-pay

## 2024-11-11 ENCOUNTER — Encounter (HOSPITAL_COMMUNITY): Payer: Self-pay | Admitting: Obstetrics and Gynecology

## 2024-11-11 LAB — BPAM RBC
Blood Product Expiration Date: 202602232359
Blood Product Expiration Date: 202602232359
Blood Product Expiration Date: 202602232359
ISSUE DATE / TIME: 202602031613
ISSUE DATE / TIME: 202602032049
ISSUE DATE / TIME: 202602032236
Unit Type and Rh: 5100
Unit Type and Rh: 5100
Unit Type and Rh: 5100

## 2024-11-11 LAB — TYPE AND SCREEN
ABO/RH(D): O POS
Antibody Screen: NEGATIVE
Unit division: 0
Unit division: 0
Unit division: 0

## 2024-11-11 LAB — CBC
HCT: 22.6 % — ABNORMAL LOW (ref 36.0–46.0)
Hemoglobin: 7.4 g/dL — ABNORMAL LOW (ref 12.0–15.0)
MCH: 25.6 pg — ABNORMAL LOW (ref 26.0–34.0)
MCHC: 32.7 g/dL (ref 30.0–36.0)
MCV: 78.2 fL — ABNORMAL LOW (ref 80.0–100.0)
Platelets: 326 10*3/uL (ref 150–400)
RBC: 2.89 MIL/uL — ABNORMAL LOW (ref 3.87–5.11)
RDW: 20.4 % — ABNORMAL HIGH (ref 11.5–15.5)
WBC: 8.4 10*3/uL (ref 4.0–10.5)
nRBC: 0.2 % (ref 0.0–0.2)

## 2024-11-11 NOTE — Plan of Care (Signed)
" °  Problem: Education: Goal: Knowledge of the prescribed therapeutic regimen will improve Outcome: Adequate for Discharge   Problem: Bowel/Gastric: Goal: Gastrointestinal status for postoperative course will improve Outcome: Adequate for Discharge   Problem: Cardiac: Goal: Ability to maintain an adequate cardiac output Outcome: Adequate for Discharge Goal: Will show no evidence of cardiac arrhythmias Outcome: Adequate for Discharge   Problem: Nutritional: Goal: Will attain and maintain optimal nutritional status Outcome: Adequate for Discharge   Problem: Neurological: Goal: Will regain or maintain usual level of consciousness Outcome: Adequate for Discharge   Problem: Clinical Measurements: Goal: Ability to maintain clinical measurements within normal limits Outcome: Adequate for Discharge Goal: Postoperative complications will be avoided or minimized Outcome: Adequate for Discharge   Problem: Respiratory: Goal: Will regain and/or maintain adequate ventilation Outcome: Adequate for Discharge Goal: Respiratory status will improve Outcome: Adequate for Discharge   Problem: Skin Integrity: Goal: Demonstrates signs of wound healing without infection Outcome: Adequate for Discharge   Problem: Urinary Elimination: Goal: Will remain free from infection Outcome: Adequate for Discharge Goal: Ability to achieve and maintain adequate urine output Outcome: Adequate for Discharge   Problem: Education: Goal: Knowledge of General Education information will improve Description: Including pain rating scale, medication(s)/side effects and non-pharmacologic comfort measures Outcome: Adequate for Discharge   Problem: Health Behavior/Discharge Planning: Goal: Ability to manage health-related needs will improve Outcome: Adequate for Discharge   Problem: Clinical Measurements: Goal: Ability to maintain clinical measurements within normal limits will improve Outcome: Adequate for  Discharge Goal: Will remain free from infection Outcome: Adequate for Discharge Goal: Diagnostic test results will improve Outcome: Adequate for Discharge Goal: Respiratory complications will improve Outcome: Adequate for Discharge Goal: Cardiovascular complication will be avoided Outcome: Adequate for Discharge   Problem: Activity: Goal: Risk for activity intolerance will decrease Outcome: Adequate for Discharge   Problem: Nutrition: Goal: Adequate nutrition will be maintained Outcome: Adequate for Discharge   Problem: Coping: Goal: Level of anxiety will decrease Outcome: Adequate for Discharge   Problem: Elimination: Goal: Will not experience complications related to bowel motility Outcome: Adequate for Discharge Goal: Will not experience complications related to urinary retention Outcome: Adequate for Discharge   Problem: Pain Managment: Goal: General experience of comfort will improve and/or be controlled Outcome: Adequate for Discharge   Problem: Safety: Goal: Ability to remain free from injury will improve Outcome: Adequate for Discharge   Problem: Skin Integrity: Goal: Risk for impaired skin integrity will decrease Outcome: Adequate for Discharge   Problem: Education: Goal: Knowledge of the prescribed therapeutic regimen will improve Outcome: Adequate for Discharge Goal: Understanding of sexual limitations or changes related to disease process or condition will improve Outcome: Adequate for Discharge Goal: Individualized Educational Video(s) Outcome: Adequate for Discharge   Problem: Self-Concept: Goal: Communication of feelings regarding changes in body function or appearance will improve Outcome: Adequate for Discharge   Problem: Skin Integrity: Goal: Demonstration of wound healing without infection will improve Outcome: Adequate for Discharge   "

## 2024-11-11 NOTE — Progress Notes (Addendum)
 Gynecology Progress Note  Admission Date: 11/10/2024 Current Date: 11/11/2024 9:07 AM  Erin Powell is a 44 y.o. [id number] HD#2/POD#1 admitted for postop ops   History complicated by: Patient Active Problem List   Diagnosis Date Noted   Fibroid 11/10/2024   Abnormal uterine bleeding 11/10/2024   Iron  deficiency anemia due to chronic blood loss 07/21/2019    ROS and patient/family/surgical history, located on admission H&P note dated 11/10/2024, have been reviewed, and there are no changes except as noted below Yesterday/Overnight Events:  Critical hemoglobin, given 1u pRBC  Subjective:  Doing ok this morning. Ambulating without dizziness or lightheadednes. Denies SOB and CP. Has been tolerating po. Able to ambulate to the bathroom. Voiding without issue. Has had vaginal bleeding but not saturating pads and notices it whenever she voids. Pain well controlled with current regimen, notes greatest improvement with oxycodone  and toradol .   Objective:   Vitals:   11/10/24 2056 11/10/24 2113 11/10/24 2251 11/11/24 0336  BP: 104/61 (!) 101/53 100/63 (!) 93/51  Pulse: 96 (!) 101 84 90  Resp: 16 16 16 16   Temp: 97.8 F (36.6 C) 98.6 F (37 C) 98.7 F (37.1 C) 98.6 F (37 C)  TempSrc: Oral Oral Oral Oral  SpO2: 98%  98% 99%  Weight:      Height:        Intake/Output Summary (Last 24 hours) at 11/11/2024 0907 Last data filed at 11/11/2024 0701 Gross per 24 hour  Intake 3778.45 ml  Output 1700 ml  Net 2078.45 ml    Physical exam: General appearance: alert, cooperative, and no distress Abdomen: soft, appropriately tender 5 laparoscopic incisions with skin glue, honeycomb dressing on lower abdomen without strikethrough Lungs: clear to auscultation bilaterally Heart: S1, S2 normal, no murmur, rub or gallop, regular rate and rhythm Extremities: no lower extremity edema Skin: intact Psych: appropriate Neurologic: Grossly normal  Medications Current Facility-Administered Medications   Medication Dose Route Frequency Provider Last Rate Last Admin   acetaminophen  (TYLENOL ) tablet 1,000 mg  1,000 mg Oral Q6H Clovis Mankins, MD   1,000 mg at 11/11/24 0335   HYDROmorphone  (DILAUDID ) injection 0.2-0.4 mg  0.2-0.4 mg Intravenous Q2H PRN Raygen Dahm, MD   0.4 mg at 11/10/24 1617   ketorolac  (TORADOL ) 30 MG/ML injection 30 mg  30 mg Intravenous Q6H Lathon Adan, MD   30 mg at 11/11/24 9348   Followed by   ibuprofen  (ADVIL ) tablet 600 mg  600 mg Oral Q6H Curstin Schmale, MD       lactated ringers  infusion   Intravenous Continuous Jaylean Buenaventura, MD 75 mL/hr at 11/11/24 0701 New Bag at 11/11/24 0701   ondansetron  (ZOFRAN ) tablet 4 mg  4 mg Oral Q6H PRN Harvest Stanco, MD       Or   ondansetron  (ZOFRAN ) injection 4 mg  4 mg Intravenous Q6H PRN Wendle Kina, MD       oxyCODONE  (Oxy IR/ROXICODONE ) immediate release tablet 5-10 mg  5-10 mg Oral Q4H PRN Elisabetta Mishra, MD   10 mg at 11/11/24 0653   polyethylene glycol (MIRALAX  / GLYCOLAX ) packet 17 g  17 g Oral Daily PRN Chakia Counts, MD       simethicone  (MYLICON) chewable tablet 80 mg  80 mg Oral QID PRN Olie Scaffidi, MD          Labs  Recent Labs  Lab 11/10/24 0600 11/10/24 1732 11/11/24 0438  WBC 5.8 11.6* 8.4  HGB 8.3* 6.6* 7.4*  HCT 26.4* 21.1* 22.6*  PLT 405* 358  326     Assessment & Plan:  POD#1  *GYN: s/p RA-myomectomy, follow up surgical pathology *Heme: s/p 1 unit of blood, asymptomatic at this time, adequate urine output, continue supplemental iron  *Pain: well controlled with po meds *FEN/GI: tolerating regular diet, d/c maintenance IV fluids *PPx: IS and SCDs *Dispo: discharge this AM  Code Status: Full Code    Carter Quarry, MD Minimally Invasive Gynecologic Surgery Center for [facility] (Faculty Practice) 11/11/24 9:07 AM

## 2024-11-11 NOTE — Discharge Summary (Addendum)
 Gynecology Physician Postoperative Discharge Summary  Patient ID: Sequoyah Ramone MRN: [id number] DOB/AGE: Oct 29, 1980 44 y.o.  Admit Date: 11/10/2024 Discharge Date: 11/11/2024  Preoperative Diagnoses: uterine fibroids, abnormal uterine bleeding, pelvic pain  Procedures: Procedures (LRB): MYOMECTOMY, ROBOT-ASSISTED MINI LAPARTOMY (N/A)  Hospital Course:  Georgeana Oertel is a 45 y.o. [id number]  admitted for scheduled surgery.  She underwent the procedures as mentioned above, her operation was complicated by EBL of . For further details about surgery, please refer to the operative report.   Patient had a postoperative course complicated by iron  deficiency anemia due to acute on chronic blood loss secondary to uterine fibroids and intraoperative blood loss of 600 mL. She was admitted for observation overnight and found to have a postoperative hgb of 6.6, down from 8.3 in the immediate preoperative period and given 1 unit PRBC.   Patient was also monitored for continued pain management, and postoperative milestones (voiding, ambulation).   By time of discharge on POD#1, she was having no signs/symptoms of acute blood loss anemia, and minimal vaginal bleeding. Her hemoglobin was increased to 7.4. Her pain was controlled on oral pain medications; she was ambulating, voiding without difficulty, tolerating regular diet and passing flatus. She was deemed stable for discharge to home.   Significant Labs:    Latest Ref Rng & Units 11/11/2024    4:38 AM 11/10/2024    5:32 PM 11/10/2024    6:00 AM  CBC  WBC 4.0 - 10.5 K/uL 8.4  11.6  5.8   Hemoglobin 12.0 - 15.0 g/dL 7.4  6.6  8.3   Hematocrit 36.0 - 46.0 % 22.6  21.1  26.4   Platelets 150 - 400 K/uL 326  358  405     Discharge Exam: Blood pressure (!) 93/58, pulse 83, temperature 37.1 C, temperature source Oral, resp. rate 17, height 5' 3 (1.6 m), weight 64.4 kg, last menstrual period 10/21/2024, SpO2 98%, unknown if currently breastfeeding. General  appearance: alert and no distress  Resp: clear to auscultation bilaterally  Cardio: regular rate and rhythm. Capillary refill appropriate GI: soft, non-tender; bowel sounds normal; no masses, no organomegaly.  Incision: C/D/I, no erythema, no drainage noted Pelvic: minimal blood on pad (done in presence of RN as chaperone)  Extremities: extremities normal, atraumatic, no cyanosis or edema and Homans sign is negative, no sign of DVT  Discharged Condition: Stable  Disposition: Discharge disposition: 01-Home or Self Care      Discharge Instructions     Change dressing (specify)   Complete by: As directed    Remove honey comb dressing in 1 week   Increase activity slowly   Complete by: As directed       Allergies as of 11/11/2024   No Known Allergies      Medication List     PAUSE taking these medications    tranexamic acid  650 MG Tabs tablet Wait to take this until: December 09, 2024 Commonly known as: LYSTEDA  Take 1 tablet (650 mg total) by mouth 3 (three) times daily.       TAKE these medications    acetaminophen  500 MG tablet Commonly known as: TYLENOL  Take 2 tablets (1,000 mg total) by mouth every 6 (six) hours as needed.   estradiol  1 MG tablet Commonly known as: ESTRACE  Take 1 tablet (1 mg total) by mouth daily.   ferrous sulfate  324 (65 Fe) MG Tbec Take 1 tablet (325 mg total) by mouth every other day.   ibuprofen  200 MG tablet Commonly known as:  ADVIL  Take 2 tablets (400 mg total) by mouth every 6 (six) hours as needed for moderate pain (pain score 4-6).   medroxyPROGESTERone  10 MG tablet Commonly known as: PROVERA  Take 1 tablet (10 mg total) by mouth daily. Take for 10 days (days 21 - 30)   MULTI-VITAMIN PO Take 1 tablet by mouth daily.   ondansetron  4 MG disintegrating tablet Commonly known as: ZOFRAN -ODT Take 1 tablet (4 mg total) by mouth every 6 (six) hours as needed for nausea.   oxyCODONE  5 MG immediate release tablet Commonly known as:  Oxy IR/ROXICODONE  Take 1 tablet (5 mg total) by mouth every 4 (four) hours as needed for severe pain (pain score 7-10) or breakthrough pain.   polyethylene glycol powder 17 GM/SCOOP powder Commonly known as: GaviLAX Take 17 g by mouth daily. Dissolve 1 capful (17g) in 4-8 ounces of liquid and take by mouth daily.               Discharge Care Instructions  (From admission, onward)           Start     Ordered   11/11/24 0000  Change dressing (specify)       Comments: Remove honey comb dressing in 1 week   11/11/24 0906           Future appointments: Office will call patient to schedule outpatient follow-up visit   Total discharge time: 15 minutes   Signed:  Jorene FORBES Moats, PA-C [facility], [facility] 11/11/24

## 2024-11-12 ENCOUNTER — Ambulatory Visit: Payer: Self-pay | Admitting: Obstetrics and Gynecology

## 2024-11-12 LAB — SURGICAL PATHOLOGY

## 2024-12-07 ENCOUNTER — Ambulatory Visit: Payer: PRIVATE HEALTH INSURANCE | Admitting: Obstetrics and Gynecology

## 2025-01-06 ENCOUNTER — Ambulatory Visit: Payer: Self-pay | Admitting: Obstetrics and Gynecology
